# Patient Record
Sex: Female | Born: 1965
Health system: Southern US, Community
[De-identification: ages and names within clinical notes are randomized; demographics above are authoritative.]

## PROBLEM LIST (undated history)

## (undated) DIAGNOSIS — N182 Chronic kidney disease, stage 2 (mild): Secondary | ICD-10-CM

## (undated) DIAGNOSIS — R002 Palpitations: Secondary | ICD-10-CM

## (undated) DIAGNOSIS — J45909 Unspecified asthma, uncomplicated: Secondary | ICD-10-CM

## (undated) DIAGNOSIS — G40209 Localization-related (focal) (partial) symptomatic epilepsy and epileptic syndromes with complex partial seizures, not intractable, without status epilepticus: Secondary | ICD-10-CM

## (undated) DIAGNOSIS — I10 Essential (primary) hypertension: Secondary | ICD-10-CM

## (undated) DIAGNOSIS — G8929 Other chronic pain: Secondary | ICD-10-CM

## (undated) DIAGNOSIS — R0602 Shortness of breath: Secondary | ICD-10-CM

## (undated) DIAGNOSIS — N289 Disorder of kidney and ureter, unspecified: Secondary | ICD-10-CM

## (undated) DIAGNOSIS — I493 Ventricular premature depolarization: Secondary | ICD-10-CM

## (undated) DIAGNOSIS — I700 Atherosclerosis of aorta: Secondary | ICD-10-CM

## (undated) DIAGNOSIS — G4730 Sleep apnea, unspecified: Secondary | ICD-10-CM

## (undated) DIAGNOSIS — R0600 Dyspnea, unspecified: Secondary | ICD-10-CM

## (undated) DIAGNOSIS — E785 Hyperlipidemia, unspecified: Secondary | ICD-10-CM

## (undated) HISTORY — DX: Shortness of breath: R06.02

## (undated) HISTORY — DX: Chronic kidney disease, stage 2 (mild): N18.2

## (undated) HISTORY — DX: Other chronic pain: G89.29

## (undated) HISTORY — DX: Hyperlipidemia, unspecified: E78.5

## (undated) HISTORY — DX: Disorder of kidney and ureter, unspecified: N28.9

## (undated) HISTORY — DX: Dyspnea, unspecified: R06.00

## (undated) HISTORY — DX: Essential (primary) hypertension: I10

## (undated) HISTORY — DX: Localization-related (focal) (partial) symptomatic epilepsy and epileptic syndromes with complex partial seizures, not intractable, without status epilepticus: G40.209

## (undated) HISTORY — DX: Sleep apnea, unspecified: G47.30

## (undated) HISTORY — DX: Palpitations: R00.2

## (undated) HISTORY — DX: Unspecified asthma, uncomplicated: J45.909

## (undated) HISTORY — DX: Ventricular premature depolarization: I49.3

## (undated) HISTORY — PX: FOOT SURGERY: SHX648

## (undated) HISTORY — DX: Atherosclerosis of aorta: I70.0

---

## 2000-08-18 ENCOUNTER — Other Ambulatory Visit: Admission: RE | Admit: 2000-08-18 | Discharge: 2000-08-18 | Payer: Self-pay | Admitting: Obstetrics and Gynecology

## 2001-10-21 ENCOUNTER — Other Ambulatory Visit: Admission: RE | Admit: 2001-10-21 | Discharge: 2001-10-21 | Payer: Self-pay | Admitting: Obstetrics and Gynecology

## 2002-11-27 ENCOUNTER — Ambulatory Visit (HOSPITAL_COMMUNITY): Admission: RE | Admit: 2002-11-27 | Discharge: 2002-11-27 | Payer: Self-pay | Admitting: Neurology

## 2002-12-01 ENCOUNTER — Ambulatory Visit (HOSPITAL_COMMUNITY): Admission: RE | Admit: 2002-12-01 | Discharge: 2002-12-02 | Payer: Self-pay | Admitting: Neurosurgery

## 2002-12-01 ENCOUNTER — Encounter: Payer: Self-pay | Admitting: Neurosurgery

## 2002-12-01 HISTORY — PX: CERVICAL DISC SURGERY: SHX588

## 2003-02-17 HISTORY — PX: ABDOMINAL HYSTERECTOMY: SHX81

## 2003-04-26 ENCOUNTER — Encounter: Admission: RE | Admit: 2003-04-26 | Discharge: 2003-07-25 | Payer: Self-pay | Admitting: Neurosurgery

## 2003-06-25 ENCOUNTER — Ambulatory Visit (HOSPITAL_COMMUNITY): Admission: RE | Admit: 2003-06-25 | Discharge: 2003-06-25 | Payer: Self-pay | Admitting: Neurosurgery

## 2003-06-25 ENCOUNTER — Encounter: Payer: Self-pay | Admitting: Neurosurgery

## 2003-07-12 ENCOUNTER — Encounter: Payer: Self-pay | Admitting: Neurosurgery

## 2003-07-12 ENCOUNTER — Encounter: Admission: RE | Admit: 2003-07-12 | Discharge: 2003-07-12 | Payer: Self-pay | Admitting: Neurosurgery

## 2003-08-02 ENCOUNTER — Encounter: Admission: RE | Admit: 2003-08-02 | Discharge: 2003-08-02 | Payer: Self-pay | Admitting: Neurosurgery

## 2003-08-02 ENCOUNTER — Encounter: Payer: Self-pay | Admitting: Neurosurgery

## 2004-05-18 HISTORY — PX: BILATERAL OOPHORECTOMY: SHX1221

## 2004-09-29 ENCOUNTER — Ambulatory Visit (HOSPITAL_COMMUNITY): Admission: RE | Admit: 2004-09-29 | Discharge: 2004-09-29 | Payer: Self-pay | Admitting: Neurology

## 2004-11-27 ENCOUNTER — Ambulatory Visit (HOSPITAL_COMMUNITY): Admission: RE | Admit: 2004-11-27 | Discharge: 2004-11-27 | Payer: Self-pay | Admitting: Neurology

## 2005-10-28 ENCOUNTER — Encounter: Admission: RE | Admit: 2005-10-28 | Discharge: 2005-10-28 | Payer: Self-pay | Admitting: Internal Medicine

## 2005-11-20 ENCOUNTER — Encounter: Admission: RE | Admit: 2005-11-20 | Discharge: 2005-11-20 | Payer: Self-pay | Admitting: Internal Medicine

## 2006-05-01 ENCOUNTER — Encounter: Admission: RE | Admit: 2006-05-01 | Discharge: 2006-05-01 | Payer: Self-pay | Admitting: Internal Medicine

## 2006-07-14 ENCOUNTER — Encounter: Admission: RE | Admit: 2006-07-14 | Discharge: 2006-07-14 | Payer: Self-pay | Admitting: Internal Medicine

## 2006-10-06 ENCOUNTER — Ambulatory Visit (HOSPITAL_COMMUNITY): Admission: RE | Admit: 2006-10-06 | Discharge: 2006-10-06 | Payer: Self-pay | Admitting: Neurosurgery

## 2006-12-15 ENCOUNTER — Encounter: Admission: RE | Admit: 2006-12-15 | Discharge: 2006-12-15 | Payer: Self-pay | Admitting: Obstetrics and Gynecology

## 2006-12-24 DIAGNOSIS — R002 Palpitations: Secondary | ICD-10-CM

## 2006-12-24 DIAGNOSIS — R0600 Dyspnea, unspecified: Secondary | ICD-10-CM

## 2006-12-24 HISTORY — DX: Palpitations: R00.2

## 2006-12-24 HISTORY — DX: Dyspnea, unspecified: R06.00

## 2007-05-01 ENCOUNTER — Ambulatory Visit (HOSPITAL_COMMUNITY): Admission: RE | Admit: 2007-05-01 | Discharge: 2007-05-03 | Payer: Self-pay | Admitting: Neurosurgery

## 2007-05-01 HISTORY — PX: OTHER SURGICAL HISTORY: SHX169

## 2008-02-10 ENCOUNTER — Encounter: Admission: RE | Admit: 2008-02-10 | Discharge: 2008-02-10 | Payer: Self-pay | Admitting: Internal Medicine

## 2008-04-19 ENCOUNTER — Encounter: Admission: RE | Admit: 2008-04-19 | Discharge: 2008-04-19 | Payer: Self-pay | Admitting: Neurology

## 2008-05-12 ENCOUNTER — Ambulatory Visit (HOSPITAL_COMMUNITY): Admission: RE | Admit: 2008-05-12 | Discharge: 2008-05-12 | Payer: Self-pay | Admitting: Neurosurgery

## 2008-10-11 ENCOUNTER — Encounter: Admission: RE | Admit: 2008-10-11 | Discharge: 2008-10-11 | Payer: Self-pay | Admitting: Obstetrics and Gynecology

## 2008-11-20 ENCOUNTER — Emergency Department (HOSPITAL_COMMUNITY): Admission: EM | Admit: 2008-11-20 | Discharge: 2008-11-20 | Payer: Self-pay | Admitting: Emergency Medicine

## 2009-02-10 ENCOUNTER — Encounter: Admission: RE | Admit: 2009-02-10 | Discharge: 2009-02-10 | Payer: Self-pay | Admitting: Internal Medicine

## 2009-02-16 ENCOUNTER — Encounter: Admission: RE | Admit: 2009-02-16 | Discharge: 2009-02-16 | Payer: Self-pay | Admitting: Internal Medicine

## 2010-02-16 ENCOUNTER — Encounter: Admission: RE | Admit: 2010-02-16 | Discharge: 2010-02-16 | Payer: Self-pay | Admitting: Obstetrics and Gynecology

## 2010-03-29 ENCOUNTER — Emergency Department (HOSPITAL_COMMUNITY): Admission: EM | Admit: 2010-03-29 | Discharge: 2010-03-29 | Payer: Self-pay | Admitting: Emergency Medicine

## 2010-04-06 ENCOUNTER — Encounter: Admission: RE | Admit: 2010-04-06 | Discharge: 2010-04-06 | Payer: Self-pay | Admitting: Neurosurgery

## 2010-04-24 ENCOUNTER — Inpatient Hospital Stay (HOSPITAL_COMMUNITY): Admission: RE | Admit: 2010-04-24 | Discharge: 2010-04-27 | Payer: Self-pay | Admitting: Neurosurgery

## 2010-04-24 HISTORY — PX: OTHER SURGICAL HISTORY: SHX169

## 2010-11-23 ENCOUNTER — Encounter
Admission: RE | Admit: 2010-11-23 | Discharge: 2010-11-23 | Payer: Self-pay | Source: Home / Self Care | Attending: Rheumatology | Admitting: Rheumatology

## 2010-12-04 ENCOUNTER — Encounter
Admission: RE | Admit: 2010-12-04 | Discharge: 2010-12-04 | Payer: Self-pay | Source: Home / Self Care | Attending: Neurosurgery | Admitting: Neurosurgery

## 2010-12-08 ENCOUNTER — Encounter: Payer: Self-pay | Admitting: Neurology

## 2010-12-09 ENCOUNTER — Encounter: Payer: Self-pay | Admitting: Rheumatology

## 2010-12-09 ENCOUNTER — Encounter: Payer: Self-pay | Admitting: Internal Medicine

## 2010-12-10 ENCOUNTER — Encounter: Payer: Self-pay | Admitting: Sports Medicine

## 2010-12-10 ENCOUNTER — Other Ambulatory Visit: Payer: Self-pay | Admitting: Obstetrics and Gynecology

## 2010-12-10 DIAGNOSIS — N644 Mastodynia: Secondary | ICD-10-CM

## 2011-02-04 LAB — MRSA PCR SCREENING: MRSA by PCR: NEGATIVE

## 2011-02-04 LAB — BASIC METABOLIC PANEL
Calcium: 9.8 mg/dL (ref 8.4–10.5)
GFR calc Af Amer: >60 mL/min (ref >60)
GFR calc non Af Amer: 59 mL/min — ABNORMAL LOW (ref >60)
Sodium: 140 mEq/L (ref 135–145)

## 2011-02-04 LAB — CBC
Hemoglobin: 13.1 g/dL (ref 12.0–15.0)
RBC: 4.11 MIL/uL (ref 3.87–5.11)
RDW: 12.6 % (ref 11.5–15.5)

## 2011-02-04 LAB — SURGICAL PCR SCREEN: MRSA, PCR: NEGATIVE

## 2011-02-05 LAB — CBC
Hemoglobin: 12.9 g/dL (ref 12.0–15.0)
RDW: 12 % (ref 11.5–15.5)

## 2011-02-05 LAB — COMPREHENSIVE METABOLIC PANEL
ALT: 21 U/L (ref 0–35)
Albumin: 4.2 g/dL (ref 3.5–5.2)
Alkaline Phosphatase: 83 U/L (ref 39–117)
Glucose, Bld: 114 mg/dL — ABNORMAL HIGH (ref 70–99)
Potassium: 3.7 mEq/L (ref 3.5–5.1)
Sodium: 138 mEq/L (ref 135–145)
Total Protein: 8 g/dL (ref 6.0–8.3)

## 2011-02-05 LAB — URINALYSIS, ROUTINE W REFLEX MICROSCOPIC
Bilirubin Urine: NEGATIVE
Hgb urine dipstick: NEGATIVE
Ketones, ur: NEGATIVE mg/dL
Protein, ur: NEGATIVE mg/dL
Urobilinogen, UA: 0.2 mg/dL (ref 0.0–1.0)

## 2011-02-05 LAB — DIFFERENTIAL
Basophils Relative: 1 % (ref 0–1)
Eosinophils Absolute: 0 10*3/uL (ref 0.0–0.7)
Eosinophils Relative: 0 % (ref 0–5)
Monocytes Absolute: 0.3 10*3/uL (ref 0.1–1.0)
Monocytes Relative: 6 % (ref 3–12)
Neutrophils Relative %: 52 % (ref 43–77)

## 2011-02-05 LAB — RAPID URINE DRUG SCREEN, HOSP PERFORMED
Cocaine: NOT DETECTED
Tetrahydrocannabinol: NOT DETECTED

## 2011-02-18 ENCOUNTER — Ambulatory Visit
Admission: RE | Admit: 2011-02-18 | Discharge: 2011-02-18 | Disposition: A | Discharge status: Home / Self Care | Payer: Medicare Other | Source: Ambulatory Visit | Attending: Obstetrics and Gynecology | Admitting: Obstetrics and Gynecology

## 2011-02-18 DIAGNOSIS — N644 Mastodynia: Secondary | ICD-10-CM

## 2011-03-04 LAB — URINALYSIS, ROUTINE W REFLEX MICROSCOPIC
Ketones, ur: NEGATIVE mg/dL
Nitrite: NEGATIVE
Specific Gravity, Urine: 1.021 (ref 1.005–1.030)
pH: 5.5 (ref 5.0–8.0)

## 2011-03-04 LAB — CBC
HCT: 39.1 % (ref 36.0–46.0)
MCV: 92.9 fL (ref 78.0–100.0)
Platelets: 228 10*3/uL (ref 150–400)
RDW: 12.3 % (ref 11.5–15.5)

## 2011-03-04 LAB — COMPREHENSIVE METABOLIC PANEL
Albumin: 4.1 g/dL (ref 3.5–5.2)
BUN: 11 mg/dL (ref 6–23)
Creatinine, Ser: 0.93 mg/dL (ref 0.4–1.2)
Potassium: 3.8 mEq/L (ref 3.5–5.1)
Total Protein: 7.6 g/dL (ref 6.0–8.3)

## 2011-03-04 LAB — DIFFERENTIAL
Lymphocytes Relative: 45 % (ref 12–46)
Monocytes Absolute: 0.3 10*3/uL (ref 0.1–1.0)
Monocytes Relative: 7 % (ref 3–12)
Neutro Abs: 2 10*3/uL (ref 1.7–7.7)

## 2011-03-04 LAB — POCT PREGNANCY, URINE: Preg Test, Ur: NEGATIVE

## 2011-03-04 LAB — URINE MICROSCOPIC-ADD ON

## 2011-04-02 NOTE — Op Note (Signed)
NAME:  [name] [name], [name] [name]               ACCOUNT NO.:  [id number]   MEDICAL RECORD NO.:  [id number]          PATIENT TYPE:  OIB   LOCATION:  3172                         FACILITY:  [facility]   PHYSICIAN:  Hilda Lias, M.D.   DATE OF BIRTH:  02-Jan-1966   DATE OF PROCEDURE:  05/01/2007  DATE OF DISCHARGE:                               OPERATIVE REPORT   PREOPERATIVE DIAGNOSIS:  C5-C6 acute herniated disc, displacement of the  spinal cord, status post C4-C5 fusion.   POSTOPERATIVE DIAGNOSES:  C5-C6 acute herniated disc, displacement of  the spinal cord, status post C4-C5 fusion.   PROCEDURE:  Anterior C5-C6 discectomy, decompression of spinal cord,  foraminotomy, interbody fusion with allograft plate, microscope.   SURGEON:  Hilda Lias, M.D.   ASSISTANT:  Payton Doughty, M.D.   CLINICAL HISTORY:  The patient underwent anterior cervical discectomy  several years ago.  Lately, she had been complaining of neck pain with  radiation to the shoulder.  X-rays showed that she had good plate and  graft at the level of C4-C5.  The MRI showed that she has a large  herniated disc with almost complete displacement of the spinal cord.  Surgery was advised.  The risks were explained in the history and  physical.   DESCRIPTION OF PROCEDURE:  The patient was taken to the OR and after  intubation, which was done in a neutral position, the neck was prepped  with DuraPrep.  Then, a transverse incision was made through the skin  and subcutaneous tissue down to the cervical spine.  Immediately we  found the old plate of Z6-X0.  There was quite a bit of scar tissue  surrounding it.  We were able to enter the disc space and with the  microscope, we did a total discectomy.  We opened the posterior ligament  and, indeed, there was three large fragments of disc compressing the  spinal cord.  Removal was accomplished and we went laterally and we  performed foraminotomy to decompress both C6 nerve roots.  The  patient  had a lot of adhesion and lysis was accomplished with the microcuret.  At the end, we had good decompression of the spinal cord and both C6  nerve roots.  Then, the end plates were drilled and the allograft 8 mm  was done.  We were able to work our way into the level of C5 and because  of that, there was no reason to remove the plate.  There was overlap of  2 mm and the four screw was inserted without any problem.  Lateral C-  spine showed good position of bone graft.  There was plenty mobilization  and plenty of space for the plate.  From then on, the area was  irrigated.  Hemostasis was accomplished with bipolar.  There was no  evidence of bleeding.  The wound was closed with Vicryl and Steri-  Strips.           ______________________________  Hilda Lias, M.D.     EB/MEDQ  D:  05/01/2007  T:  05/02/2007  Job:  [id number]

## 2011-04-02 NOTE — H&P (Signed)
NAME:  [name], [name]               ACCOUNT NO.:  [id number]   MEDICAL RECORD NO.:  [id number]          PATIENT TYPE:  AMB   LOCATION:  SDS                          FACILITY:  [facility]   PHYSICIAN:  Hilda Lias, M.D.   DATE OF BIRTH:  August 19, 1966   DATE OF ADMISSION:  05/01/2007  DATE OF DISCHARGE:                              HISTORY & PHYSICAL   [name] [name] is a lady who was seen in my office and later on by the  neurologist because of neck pain.  Previously this lady has a fusion at  level 4-5 three or four years ago.  Nevertheless, in November last year  we did a myelogram which shows some stenosis at 4, 5 and 6.  She went to  see the neurologist because the pain was getting worse and they  proceeded with a cervical MRI which showed severe herniated disk at  level 5-6.  Because of these findings she was seen by me yesterday.  I  brought her husband to the office and last night will make a decision  about surgery.   PAST MEDICAL HISTORY:  Anterior cervical diskectomy at 4-5.  Hysterectomy.   ALLERGIES:  She is allergic to SULFA some SUN LOTION.   FAMILY HISTORY:  Unremarkable.   REVIEW OF SYSTEMS:  Positive for neck pain.   PHYSICAL EXAMINATION:  HEAD, EARS, NOSE, THROAT:  Normal.  NECK: She has scar anteriorly.  She has almost completely fixation of  the neck.  She is afraid to move her neck because of pain.  LUNGS:  Clear.  HEART:  Sounds are normal.  No murmurs.  __________.  NEUROLOGIC:  She has decreased flexion of cervical spine.  She has  weakness in the biceps.  Reflexes are 1+.  __________.   DATA REVIEWED:  Cervical MRI shows fusion of 4-5 and a large herniated  disk at the level of 5-6 with displacement of the spinal cord.  She has  a cervical spondylosis. __________.   IMPRESSION:  C5-6 herniated disk.   RECOMMENDATIONS:  The patient is being admitted for surgery.  The  procedure will be anterior cervical diskectomy at level 5-6.  The  surgeon will make the  decision about if we need to remove the plate to  put a new at level 5-6.  The risks include infection, CSF leak,  paralysis, need for surgery, worsening pain.           ______________________________  Hilda Lias, M.D.     EB/MEDQ  D:  05/01/2007  T:  05/02/2007  Job:  [id number]
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
## 2011-07-06 ENCOUNTER — Other Ambulatory Visit: Payer: Self-pay | Admitting: Neurosurgery

## 2011-07-06 DIAGNOSIS — M5416 Radiculopathy, lumbar region: Secondary | ICD-10-CM

## 2011-07-06 DIAGNOSIS — M549 Dorsalgia, unspecified: Secondary | ICD-10-CM

## 2011-07-06 DIAGNOSIS — M542 Cervicalgia: Secondary | ICD-10-CM

## 2011-07-06 DIAGNOSIS — M5412 Radiculopathy, cervical region: Secondary | ICD-10-CM

## 2011-07-10 ENCOUNTER — Ambulatory Visit
Admission: RE | Admit: 2011-07-10 | Discharge: 2011-07-10 | Disposition: A | Discharge status: Home / Self Care | Payer: Medicare Other | Source: Ambulatory Visit | Attending: Neurosurgery | Admitting: Neurosurgery

## 2011-07-10 DIAGNOSIS — M5416 Radiculopathy, lumbar region: Secondary | ICD-10-CM

## 2011-07-10 DIAGNOSIS — M549 Dorsalgia, unspecified: Secondary | ICD-10-CM

## 2011-07-10 DIAGNOSIS — M5412 Radiculopathy, cervical region: Secondary | ICD-10-CM

## 2011-07-10 DIAGNOSIS — M542 Cervicalgia: Secondary | ICD-10-CM

## 2011-07-10 MED ORDER — MEPERIDINE HCL 100 MG/ML IJ SOLN
75.0000 mg | Freq: Once | INTRAMUSCULAR | Status: AC
  Administered 2011-07-10: 75 mg via INTRAMUSCULAR

## 2011-07-10 MED ORDER — DIAZEPAM 2 MG PO TABS
10.0000 mg | ORAL_TABLET | Freq: Once | ORAL | Status: AC
  Administered 2011-07-10: 10 mg via ORAL

## 2011-07-10 MED ORDER — ONDANSETRON HCL 4 MG/2ML IJ SOLN
4.0000 mg | Freq: Once | INTRAMUSCULAR | Status: AC
  Administered 2011-07-10: 4 mg via INTRAMUSCULAR

## 2011-07-10 MED ORDER — IOHEXOL 300 MG/ML  SOLN
10.0000 mL | Freq: Once | INTRAMUSCULAR | Status: AC | PRN
  Administered 2011-07-10: 10 mL via INTRATHECAL

## 2011-07-10 MED ORDER — SODIUM CHLORIDE 0.9 % IV SOLN: 4.0000 mg | Freq: Four times a day (QID) | INTRAVENOUS | Status: DC | PRN

## 2011-07-10 NOTE — Progress Notes (Signed)
Pt resting quietly post procedure, husband at bedside

## 2011-09-05 LAB — CBC
HCT: 39.8
Hemoglobin: 13.3
MCHC: 33.5
MCV: 91.7
RBC: 4.34

## 2011-09-05 LAB — BASIC METABOLIC PANEL
CO2: 29
Chloride: 104
GFR calc Af Amer: >60
Glucose, Bld: 101 — ABNORMAL HIGH
Potassium: 4
Sodium: 141

## 2012-02-04 ENCOUNTER — Other Ambulatory Visit: Payer: Self-pay | Admitting: Obstetrics and Gynecology

## 2012-02-04 DIAGNOSIS — Z1231 Encounter for screening mammogram for malignant neoplasm of breast: Secondary | ICD-10-CM

## 2012-02-24 ENCOUNTER — Ambulatory Visit
Admission: RE | Admit: 2012-02-24 | Discharge: 2012-02-24 | Disposition: A | Discharge status: Home / Self Care | Payer: 59 | Source: Ambulatory Visit | Attending: Obstetrics and Gynecology | Admitting: Obstetrics and Gynecology

## 2012-02-24 DIAGNOSIS — Z1231 Encounter for screening mammogram for malignant neoplasm of breast: Secondary | ICD-10-CM

## 2012-03-30 ENCOUNTER — Other Ambulatory Visit: Payer: Self-pay | Admitting: Neurosurgery

## 2012-03-30 ENCOUNTER — Ambulatory Visit
Admission: RE | Admit: 2012-03-30 | Discharge: 2012-03-30 | Disposition: A | Discharge status: Home / Self Care | Payer: 59 | Source: Ambulatory Visit | Attending: Neurosurgery | Admitting: Neurosurgery

## 2012-03-30 DIAGNOSIS — M542 Cervicalgia: Secondary | ICD-10-CM

## 2012-04-20 DIAGNOSIS — R0602 Shortness of breath: Secondary | ICD-10-CM

## 2012-04-20 HISTORY — DX: Shortness of breath: R06.02

## 2012-04-20 LAB — PULMONARY FUNCTION TEST

## 2012-08-31 ENCOUNTER — Other Ambulatory Visit: Payer: Self-pay | Admitting: Anesthesiology

## 2012-08-31 DIAGNOSIS — M542 Cervicalgia: Secondary | ICD-10-CM

## 2012-09-01 ENCOUNTER — Other Ambulatory Visit: Payer: 59

## 2012-09-01 ENCOUNTER — Ambulatory Visit
Admission: RE | Admit: 2012-09-01 | Discharge: 2012-09-01 | Disposition: A | Discharge status: Home / Self Care | Payer: 59 | Source: Ambulatory Visit | Attending: Anesthesiology | Admitting: Anesthesiology

## 2012-09-01 VITALS — BP 149/90 | HR 72

## 2012-09-01 DIAGNOSIS — M542 Cervicalgia: Secondary | ICD-10-CM

## 2012-09-01 MED ORDER — IOHEXOL 300 MG/ML  SOLN
10.0000 mL | Freq: Once | INTRAMUSCULAR | Status: AC | PRN
  Administered 2012-09-01: 10 mL via INTRATHECAL

## 2012-09-01 MED ORDER — ONDANSETRON HCL 4 MG/2ML IJ SOLN: 4.0000 mg | Freq: Four times a day (QID) | INTRAMUSCULAR | Status: DC | PRN

## 2012-09-01 MED ORDER — DIAZEPAM 5 MG PO TABS
10.0000 mg | ORAL_TABLET | Freq: Once | ORAL | Status: AC
  Administered 2012-09-01: 10 mg via ORAL

## 2012-09-01 NOTE — Progress Notes (Addendum)
Post myelo sat pt up for about 1 minute to try to get contrast out of neck, then cold pack applied. Pt states it already feels a little better.  Husband at bedside.

## 2012-09-03 ENCOUNTER — Other Ambulatory Visit: Payer: 59

## 2012-09-30 ENCOUNTER — Other Ambulatory Visit: Payer: Self-pay | Admitting: Neurosurgery

## 2012-09-30 DIAGNOSIS — M549 Dorsalgia, unspecified: Secondary | ICD-10-CM

## 2012-09-30 DIAGNOSIS — M546 Pain in thoracic spine: Secondary | ICD-10-CM

## 2012-10-04 ENCOUNTER — Ambulatory Visit
Admission: RE | Admit: 2012-10-04 | Discharge: 2012-10-04 | Disposition: A | Discharge status: Home / Self Care | Payer: 59 | Source: Ambulatory Visit | Attending: Neurosurgery | Admitting: Neurosurgery

## 2012-10-04 DIAGNOSIS — M549 Dorsalgia, unspecified: Secondary | ICD-10-CM

## 2012-10-04 DIAGNOSIS — M546 Pain in thoracic spine: Secondary | ICD-10-CM

## 2013-01-19 ENCOUNTER — Other Ambulatory Visit: Payer: Self-pay

## 2013-02-26 ENCOUNTER — Ambulatory Visit: Payer: 59

## 2013-03-12 ENCOUNTER — Other Ambulatory Visit: Payer: Self-pay | Admitting: Nurse Practitioner

## 2013-03-12 DIAGNOSIS — R109 Unspecified abdominal pain: Secondary | ICD-10-CM

## 2013-03-12 DIAGNOSIS — R1031 Right lower quadrant pain: Secondary | ICD-10-CM

## 2013-03-17 ENCOUNTER — Ambulatory Visit: Payer: 59

## 2013-03-17 ENCOUNTER — Other Ambulatory Visit: Payer: 59

## 2013-03-19 ENCOUNTER — Other Ambulatory Visit: Payer: 59

## 2013-03-19 ENCOUNTER — Other Ambulatory Visit: Payer: Self-pay | Admitting: Nurse Practitioner

## 2013-03-19 ENCOUNTER — Ambulatory Visit
Admission: RE | Admit: 2013-03-19 | Discharge: 2013-03-19 | Disposition: A | Discharge status: Home / Self Care | Payer: 59 | Source: Ambulatory Visit | Attending: Nurse Practitioner | Admitting: Nurse Practitioner

## 2013-03-19 DIAGNOSIS — R1031 Right lower quadrant pain: Secondary | ICD-10-CM

## 2013-03-19 DIAGNOSIS — R109 Unspecified abdominal pain: Secondary | ICD-10-CM

## 2013-03-20 ENCOUNTER — Encounter: Payer: Self-pay | Admitting: Pharmacist Clinician (PhC)/ Clinical Pharmacy Specialist

## 2013-03-23 ENCOUNTER — Encounter: Payer: Self-pay | Admitting: Cardiovascular Disease

## 2013-03-24 ENCOUNTER — Ambulatory Visit
Admission: RE | Admit: 2013-03-24 | Discharge: 2013-03-24 | Disposition: A | Discharge status: Home / Self Care | Payer: 59 | Source: Ambulatory Visit

## 2013-03-24 DIAGNOSIS — Z1231 Encounter for screening mammogram for malignant neoplasm of breast: Secondary | ICD-10-CM

## 2013-03-31 ENCOUNTER — Ambulatory Visit: Payer: 59 | Admitting: Cardiovascular Disease

## 2013-04-05 ENCOUNTER — Other Ambulatory Visit: Payer: Self-pay | Admitting: Internal Medicine

## 2013-04-05 DIAGNOSIS — K4090 Unilateral inguinal hernia, without obstruction or gangrene, not specified as recurrent: Secondary | ICD-10-CM

## 2013-04-07 ENCOUNTER — Ambulatory Visit
Admission: RE | Admit: 2013-04-07 | Discharge: 2013-04-07 | Disposition: A | Discharge status: Home / Self Care | Payer: 59 | Source: Ambulatory Visit | Attending: Internal Medicine | Admitting: Internal Medicine

## 2013-04-07 DIAGNOSIS — K4090 Unilateral inguinal hernia, without obstruction or gangrene, not specified as recurrent: Secondary | ICD-10-CM

## 2013-04-07 MED ORDER — IOHEXOL 300 MG/ML  SOLN
100.0000 mL | Freq: Once | INTRAMUSCULAR | Status: AC | PRN
  Administered 2013-04-07: 100 mL via INTRAVENOUS

## 2013-04-09 ENCOUNTER — Emergency Department (HOSPITAL_COMMUNITY): Payer: 59

## 2013-04-09 ENCOUNTER — Emergency Department (HOSPITAL_COMMUNITY)
Admission: EM | Admit: 2013-04-09 | Discharge: 2013-04-09 | Disposition: A | Discharge status: Home / Self Care | Payer: 59 | Attending: Emergency Medicine | Admitting: Emergency Medicine

## 2013-04-09 ENCOUNTER — Encounter (HOSPITAL_COMMUNITY): Payer: Self-pay | Admitting: Emergency Medicine

## 2013-04-09 DIAGNOSIS — Y9389 Activity, other specified: Secondary | ICD-10-CM | POA: Insufficient documentation

## 2013-04-09 DIAGNOSIS — IMO0002 Reserved for concepts with insufficient information to code with codable children: Secondary | ICD-10-CM | POA: Insufficient documentation

## 2013-04-09 DIAGNOSIS — Z8679 Personal history of other diseases of the circulatory system: Secondary | ICD-10-CM | POA: Insufficient documentation

## 2013-04-09 DIAGNOSIS — Z79899 Other long term (current) drug therapy: Secondary | ICD-10-CM | POA: Insufficient documentation

## 2013-04-09 DIAGNOSIS — M791 Myalgia, unspecified site: Secondary | ICD-10-CM

## 2013-04-09 DIAGNOSIS — I10 Essential (primary) hypertension: Secondary | ICD-10-CM | POA: Insufficient documentation

## 2013-04-09 DIAGNOSIS — Z8709 Personal history of other diseases of the respiratory system: Secondary | ICD-10-CM | POA: Insufficient documentation

## 2013-04-09 DIAGNOSIS — Y92410 Unspecified street and highway as the place of occurrence of the external cause: Secondary | ICD-10-CM | POA: Insufficient documentation

## 2013-04-09 DIAGNOSIS — G40909 Epilepsy, unspecified, not intractable, without status epilepticus: Secondary | ICD-10-CM | POA: Insufficient documentation

## 2013-04-09 DIAGNOSIS — S0993XA Unspecified injury of face, initial encounter: Secondary | ICD-10-CM | POA: Insufficient documentation

## 2013-04-09 DIAGNOSIS — R209 Unspecified disturbances of skin sensation: Secondary | ICD-10-CM | POA: Insufficient documentation

## 2013-04-09 DIAGNOSIS — Z981 Arthrodesis status: Secondary | ICD-10-CM | POA: Insufficient documentation

## 2013-04-09 DIAGNOSIS — Z87828 Personal history of other (healed) physical injury and trauma: Secondary | ICD-10-CM | POA: Insufficient documentation

## 2013-04-09 DIAGNOSIS — Z7982 Long term (current) use of aspirin: Secondary | ICD-10-CM | POA: Insufficient documentation

## 2013-04-09 MED ORDER — HYDROCODONE-ACETAMINOPHEN 5-325 MG PO TABS: 1.0000 | ORAL_TABLET | Freq: Once | ORAL | Status: DC

## 2013-04-09 MED ORDER — HYDROCODONE-ACETAMINOPHEN 5-325 MG PO TABS
1.0000 | ORAL_TABLET | Freq: Once | ORAL | Status: AC
  Administered 2013-04-09: 1 via ORAL
  Filled 2013-04-09: qty 1

## 2013-04-09 MED ORDER — CYCLOBENZAPRINE HCL 10 MG PO TABS: 10.0000 mg | ORAL_TABLET | Freq: Two times a day (BID) | ORAL | Status: DC | PRN

## 2013-04-09 NOTE — ED Notes (Signed)
Pt returned from MRI °
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
## 2013-04-09 NOTE — ED Notes (Signed)
Patient transported to MRI 

## 2013-04-09 NOTE — ED Provider Notes (Signed)
Medical screening examination/treatment/procedure(s) were performed by non-physician practitioner and as supervising physician I was immediately available for consultation/collaboration.    Celene Kras, MD 04/09/13 [phone]

## 2013-04-09 NOTE — ED Notes (Signed)
Patient to xray.

## 2013-04-09 NOTE — ED Notes (Signed)
Attempted hourly rounding - pt not in room

## 2013-04-09 NOTE — ED Notes (Signed)
Pt complains of pain in lumbar spine as well as middle of thoracic region.

## 2013-04-09 NOTE — ED Notes (Signed)
Went to take vitals but pt has not returned to room from MRI
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
## 2013-04-09 NOTE — ED Provider Notes (Signed)
Medical screening examination/treatment/procedure(s) were performed by non-physician practitioner and as supervising physician I was immediately available for consultation/collaboration.    Celene Kras, MD 04/09/13 [phone]
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
## 2013-05-07 ENCOUNTER — Other Ambulatory Visit: Payer: Self-pay | Admitting: Neurosurgery

## 2013-05-07 DIAGNOSIS — M545 Low back pain: Secondary | ICD-10-CM

## 2013-05-14 ENCOUNTER — Ambulatory Visit
Admission: RE | Admit: 2013-05-14 | Discharge: 2013-05-14 | Disposition: A | Discharge status: Home / Self Care | Payer: 59 | Source: Ambulatory Visit | Attending: Neurosurgery | Admitting: Neurosurgery

## 2013-05-14 DIAGNOSIS — M545 Low back pain: Secondary | ICD-10-CM
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
## 2013-07-06 ENCOUNTER — Ambulatory Visit (INDEPENDENT_AMBULATORY_CARE_PROVIDER_SITE_OTHER): Payer: 59 | Admitting: Physician Assistant

## 2013-07-06 ENCOUNTER — Encounter: Payer: Self-pay | Admitting: Physician Assistant

## 2013-07-06 ENCOUNTER — Telehealth: Payer: Self-pay | Admitting: Cardiovascular Disease

## 2013-07-06 VITALS — BP 140/90 | HR 62 | Ht 67.0 in | Wt 174.2 lb

## 2013-07-06 DIAGNOSIS — R002 Palpitations: Secondary | ICD-10-CM

## 2013-07-06 DIAGNOSIS — I499 Cardiac arrhythmia, unspecified: Secondary | ICD-10-CM | POA: Insufficient documentation

## 2013-07-06 DIAGNOSIS — I4949 Other premature depolarization: Secondary | ICD-10-CM

## 2013-07-06 DIAGNOSIS — I10 Essential (primary) hypertension: Secondary | ICD-10-CM

## 2013-07-06 DIAGNOSIS — I493 Ventricular premature depolarization: Secondary | ICD-10-CM | POA: Insufficient documentation

## 2013-07-06 DIAGNOSIS — R000 Tachycardia, unspecified: Secondary | ICD-10-CM

## 2013-07-06 NOTE — Telephone Encounter (Signed)
Pt asked that Dr. Doristine Locks needs to be removed off her list of doctors that she sees. She is no longer seeing this physician

## 2013-07-06 NOTE — Telephone Encounter (Signed)
Dr. Doristine Locks removed from [facility] at patients' request.

## 2013-07-06 NOTE — Patient Instructions (Signed)
Your physician has recommended that you wear an event monitor. Event monitors are medical devices that record the heart's electrical activity. Doctors most often [place] these monitors to diagnose arrhythmias. Arrhythmias are problems with the speed or rhythm of the heartbeat. The monitor is a small, portable device. You can wear one while you do your normal daily activities. This is usually used to diagnose what is causing palpitations/syncope (passing out).  Your physician recommends that you schedule a follow-up appointment with Dr.Berry after monitor. Sooner if needed.

## 2013-07-06 NOTE — Assessment & Plan Note (Signed)
Patient's son noticed increased frequency of periods where she has an irregular heart rate. She said episode today which lasted approximately 20 minutes.  She will be set up with a CardioNet monitor and follow up in 2 weeks.

## 2013-07-06 NOTE — Progress Notes (Signed)
Date:  07/06/2013   ID:  [name] [name], DOB 1966-10-11, MRN [id number]  PCP:  [name] [name]., MD  Primary Cardiologist:  Allyson Sabal     History of Present Illness: [name] [name] is a 47 y.o. female was accompanied by her husband [name] [name] has a history of PVCs hypertension, partials complex seizures. 2-D echocardiogram 12/24/2006 which showed normal LV systolic function a Myoview performed the same time which was nonischemic in the mid chest July 2013 which was normal.  Patient presents today due to increased episodes of irregular heart rate. She states that he happen mostly at night however this morning she had one that lasted approximately 20 minutes these episodes happen 4-5 times per day. She notices increasing shortness of breath, fatigue, dizziness. She states that "it feels like he couldn't catch my breath". She tried using her wrist blood pressure cuff and it would not work.  The patient currently denies nausea, vomiting, fever, chest pain, orthopnea, PND, cough, congestion, abdominal pain, hematochezia, melena, lower extremity edema.  Wt Readings from Last 3 Encounters:  07/06/13 174 lb 3.2 oz (79.017 kg)     Past Medical History  Diagnosis Date  . Palpitations 12/24/2006    Echo - EF >55%; flow pattern normal for age; aortic valve mildly sclerotic  . SOB (shortness of breath) 04/20/2012    MET test - exercise limiting factor - chronotropic incompetence (medication), peak VO2 76% of predicted; no EKG ST changes noted at sub-optimal peak HR  . Dyspnea 12/24/2006    B/P MV - normal perfusion in all regions; post stress LV normal in size; EF 63%; no significant ischemia noted  . Palpitations 01/01/2007    30 day event monitor - 69 pt reported events- some PAC and PVCs reported  . Hypertension   . Complex partial seizures     Current Outpatient Prescriptions  Medication Sig Dispense Refill  . acetaminophen (TYLENOL) 500 MG tablet Take 1,000 mg by mouth every 6 (six) hours as  needed for pain (pain).      Marland Kitchen aspirin EC 81 MG tablet Take 81 mg by mouth daily.      . baclofen (LIORESAL) 20 MG tablet Take 20 mg by mouth 2 (two) times daily.      . cyclobenzaprine (FLEXERIL) 10 MG tablet Take 1 tablet (10 mg total) by mouth 2 (two) times daily as needed for muscle spasms.  20 tablet  0  . diltiazem (CARDIZEM SR) 120 MG 12 hr capsule Take 120 mg by mouth 2 (two) times daily.      Marland Kitchen gabapentin (NEURONTIN) 300 MG capsule Take 300 mg by mouth 2 (two) times daily.      Marland Kitchen HYDROcodone-acetaminophen (NORCO/VICODIN) 5-325 MG per tablet Take 1 tablet by mouth once.  15 tablet  0  . ibuprofen (ADVIL,MOTRIN) 200 MG tablet Take 200 mg by mouth every 6 (six) hours as needed for pain.      Marland Kitchen therapeutic multivitamin-minerals (THERAGRAN-M) tablet Take 1 tablet by mouth every other day.       No current facility-administered medications for this visit.    Allergies:    Allergies  Allergen Reactions  . Sarna [Lip Medex] Other (See Comments)    resp distress  . Sulfa Antibiotics Other (See Comments)    "General malaze"    Social History:  The patient  reports that she has never smoked. She has never used smokeless tobacco.   Family history:   Family History  Problem Relation Age of Onset  .  Diabetes Mother   . Hypertension Mother   . Cancer - Other Mother     breast    ROS:  Please see the history of present illness.  All other systems reviewed and negative.   PHYSICAL EXAM: VS:  BP 140/90  Pulse 62  Ht 5\' 7"  (1.702 m)  Wt 174 lb 3.2 oz (79.017 kg)  BMI 27.28 kg/m2 Well nourished, well developed, in no acute distress HEENT: Pupils are equal round react to light accommodation extraocular movements are intact.  Neck: no JVDNo cervical lymphadenopathy. Cardiac: Regular rate and rhythm without murmurs rubs or gallops. Lungs:  clear to auscultation bilaterally, no wheezing, rhonchi or rales Abd: soft, nontender, positive bowel sounds all quadrants, no  hepatosplenomegaly Ext: no lower extremity edema.  2+ radial and dorsalis pedis pulses. Skin: warm and dry Neuro:  Grossly normal  EKG:  Normal sinus rhythm rate 62 beats per minute  ASSESSMENT AND PLAN:  Problem List Items Addressed This Visit   PVC's (premature ventricular contractions)   Irregular heart rate     Patient's son noticed increased frequency of periods where she has an irregular heart rate. She said episode today which lasted approximately 20 minutes.  She will be set up with a CardioNet monitor and follow up in 2 weeks.      Essential hypertension    Other Visit Diagnoses   Heart palpitations    -  Primary    Relevant Orders       EKG 12-Lead       Cardiac event monitor            Increase aspirin to 325 mg daily

## 2013-08-12 ENCOUNTER — Ambulatory Visit: Payer: 59 | Admitting: Cardiovascular Disease

## 2013-08-16 ENCOUNTER — Ambulatory Visit (INDEPENDENT_AMBULATORY_CARE_PROVIDER_SITE_OTHER): Payer: 59 | Admitting: Cardiovascular Disease

## 2013-08-16 ENCOUNTER — Encounter: Payer: Self-pay | Admitting: Cardiovascular Disease

## 2013-08-16 VITALS — BP 160/84 | HR 80 | Ht 67.0 in | Wt 173.0 lb

## 2013-08-16 DIAGNOSIS — I4949 Other premature depolarization: Secondary | ICD-10-CM

## 2013-08-16 DIAGNOSIS — I493 Ventricular premature depolarization: Secondary | ICD-10-CM

## 2013-08-16 NOTE — Patient Instructions (Addendum)
Your physician wants you to follow-up in: 1 year with Dr Berry. You will receive a reminder letter in the mail two months in advance. If you don't receive a letter, please call our office to schedule the follow-up appointment.  

## 2013-08-16 NOTE — Assessment & Plan Note (Signed)
The patient saw Huey Bienenstock PA-C in the office 07/06/13 with complaints of palpitations. He ordered a one-month event monitor which showed occasional unifocal PVCs. The patient  relates eating excessive amount of chocolate chip cookies along with TE suggesting increased caffeine intake which is subtotally stopped doing that her palpitations have improved.

## 2013-08-16 NOTE — Progress Notes (Signed)
08/16/2013 [name] [name]   04/10/1966  [id number]  Primary Physician Alva Garnet., MD Primary Cardiologist: Runell Gess MD [name] [name]   HPI:  The patient is a 47 year old, mildly overweight, married [place] American female, mother of 2 children who is accompanied by her husband today, [name] [name], who is also a patient of mine. I last saw her a year ago. She has a history of PVCs and hypertension. She also has had cervical disk disease and has been operated on by Dr. Hilda Lias. She has complex partial seizures. Event monitor in the past has shown PVCs. She did have a 2D echo performed December 24, 2006, which revealed normal LV systolic function and a Myoview performed at the same time which was nonischemic. Her major complaints were of  increasing dyspnea on exertion for unclear reasons. I performed a MET test which was normal. sshe saw Huey Bienenstock PAC back in the office last month with complaints of palpitations that are increasing in frequency and/or severity. He wear an event monitor that you showed occasional unifocal PVCs. She did admit to increasing congestion of chocolate chip cookies and tea of which are caffeinated. Since stopping both of those foods her palpitations have improved.     Current Outpatient Prescriptions  Medication Sig Dispense Refill  . acetaminophen (TYLENOL) 500 MG tablet Take 1,000 mg by mouth every 6 (six) hours as needed for pain (pain).      Marland Kitchen aspirin 325 MG tablet Take 325 mg by mouth daily.      . baclofen (LIORESAL) 20 MG tablet Take 20 mg by mouth 2 (two) times daily.      . cyclobenzaprine (FLEXERIL) 10 MG tablet Take 1 tablet (10 mg total) by mouth 2 (two) times daily as needed for muscle spasms.  20 tablet  0  . diltiazem (CARDIZEM SR) 120 MG 12 hr capsule Take 120 mg by mouth 2 (two) times daily.      Marland Kitchen gabapentin (NEURONTIN) 300 MG capsule Take 300 mg by mouth. 300mg  in the morning and 600mg  in the evening      .  HYDROcodone-acetaminophen (NORCO/VICODIN) 5-325 MG per tablet Take 1 tablet by mouth once.  15 tablet  0  . ibuprofen (ADVIL,MOTRIN) 200 MG tablet Take 200 mg by mouth every 6 (six) hours as needed for pain.      Marland Kitchen lamoTRIgine (LAMICTAL) 25 MG tablet Take 25 mg by mouth. 2 tablets daily      . Multiple Vitamin (MULTIVITAMIN) capsule Take 1 capsule by mouth daily. With iron       No current facility-administered medications for this visit.    Allergies  Allergen Reactions  . Sulfa Antibiotics Other (See Comments)    "General malaze"  . Sarna Sensitive     Respiratory distress with the Sarna lotion    History   Social History  . Marital Status: Married    Spouse Name: N/A    Number of Children: N/A  . Years of Education: N/A   Occupational History  . Not on file.   Social History Main Topics  . Smoking status: Never Smoker   . Smokeless tobacco: Never Used  . Alcohol Use: Not on file  . Drug Use: Not on file  . Sexual Activity: Not on file   Other Topics Concern  . Not on file   Social History Narrative  . No narrative on file     Review of Systems: General: negative for chills, fever, night sweats or weight  changes.  Cardiovascular: negative for chest pain, dyspnea on exertion, edema, orthopnea, palpitations, paroxysmal nocturnal dyspnea or shortness of breath Dermatological: negative for rash Respiratory: negative for cough or wheezing Urologic: negative for hematuria Abdominal: negative for nausea, vomiting, diarrhea, bright red blood per rectum, melena, or hematemesis Neurologic: negative for visual changes, syncope, or dizziness All other systems reviewed and are otherwise negative except as noted above.    Blood pressure 160/84, pulse 80, height 5\' 7"  (1.702 m), weight 173 lb (78.472 kg).  General appearance: alert and no distress Neck: no adenopathy, no carotid bruit, no JVD, supple, symmetrical, trachea midline and thyroid not enlarged, symmetric, no  tenderness/mass/nodules Lungs: clear to auscultation bilaterally Heart: regular rate and rhythm, S1, S2 normal, no murmur, click, rub or gallop Extremities: extremities normal, atraumatic, no cyanosis or edema  EKG not performed today  ASSESSMENT AND PLAN:   PVC's (premature ventricular contractions) The patient saw Huey Bienenstock PA-C in the office 07/06/13 with complaints of palpitations. He ordered a one-month event monitor which showed occasional unifocal PVCs. The patient  relates eating excessive amount of chocolate chip cookies along with TE suggesting increased caffeine intake which is subtotally stopped doing that her palpitations have improved.      Runell Gess MD FACP,FACC,FAHA, [facility] 08/16/2013 4:19 PM

## 2013-08-17 ENCOUNTER — Telehealth: Payer: Self-pay | Admitting: Cardiovascular Disease
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
## 2013-08-17 NOTE — Telephone Encounter (Signed)
Please call question about her monitor report ,also forgot to give him some information when she saw him yesterday.

## 2013-08-18 ENCOUNTER — Telehealth: Payer: Self-pay | Admitting: *Deleted

## 2013-08-18 NOTE — Telephone Encounter (Signed)
Called w/event monitor results.  States Dr. Allyson Sabal discussed at OV on 08/17/13.

## 2013-08-20 ENCOUNTER — Telehealth: Payer: Self-pay | Admitting: Cardiovascular Disease

## 2013-08-20 NOTE — Telephone Encounter (Signed)
Wants to know if she need to decrease her aspirin back to 81 mg?

## 2013-08-20 NOTE — Telephone Encounter (Signed)
Pt states that Wilburt Finlay had increase ASA 325 mg prior to monitor being placed. She wants to know if she needs to return back to ASA 81. Will defer to [facility] and call her back.

## 2013-08-24 NOTE — Telephone Encounter (Signed)
I talked to bryan and received orders to cut asa back to 81mg . I will call pt. And let her know.

## 2013-09-23 ENCOUNTER — Other Ambulatory Visit: Payer: Self-pay

## 2013-11-03 ENCOUNTER — Other Ambulatory Visit: Payer: Self-pay | Admitting: Internal Medicine

## 2013-11-03 DIAGNOSIS — N6320 Unspecified lump in the left breast, unspecified quadrant: Secondary | ICD-10-CM

## 2013-11-03 DIAGNOSIS — N6310 Unspecified lump in the right breast, unspecified quadrant: Secondary | ICD-10-CM

## 2013-11-16 ENCOUNTER — Other Ambulatory Visit: Payer: 59

## 2013-11-16 ENCOUNTER — Ambulatory Visit
Admission: RE | Admit: 2013-11-16 | Discharge: 2013-11-16 | Disposition: A | Discharge status: Home / Self Care | Payer: 59 | Source: Ambulatory Visit | Attending: Internal Medicine | Admitting: Internal Medicine

## 2013-11-16 DIAGNOSIS — N6310 Unspecified lump in the right breast, unspecified quadrant: Secondary | ICD-10-CM

## 2013-11-16 DIAGNOSIS — N6320 Unspecified lump in the left breast, unspecified quadrant: Secondary | ICD-10-CM

## 2014-02-17 ENCOUNTER — Other Ambulatory Visit (HOSPITAL_COMMUNITY): Payer: Self-pay | Admitting: Otolaryngology

## 2014-02-17 DIAGNOSIS — J329 Chronic sinusitis, unspecified: Secondary | ICD-10-CM

## 2014-02-21 ENCOUNTER — Ambulatory Visit (HOSPITAL_COMMUNITY): Payer: 59

## 2014-02-23 ENCOUNTER — Ambulatory Visit (HOSPITAL_COMMUNITY)
Admission: RE | Admit: 2014-02-23 | Discharge: 2014-02-23 | Disposition: A | Discharge status: Home / Self Care | Payer: 59 | Source: Ambulatory Visit | Attending: Otolaryngology | Admitting: Otolaryngology

## 2014-02-23 ENCOUNTER — Encounter (HOSPITAL_COMMUNITY): Payer: Self-pay

## 2014-02-23 ENCOUNTER — Other Ambulatory Visit (HOSPITAL_COMMUNITY): Payer: Self-pay | Admitting: Otolaryngology

## 2014-02-23 DIAGNOSIS — J329 Chronic sinusitis, unspecified: Secondary | ICD-10-CM

## 2014-02-23 DIAGNOSIS — R51 Headache: Secondary | ICD-10-CM | POA: Insufficient documentation

## 2014-02-23 MED ORDER — IOHEXOL 300 MG/ML  SOLN
100.0000 mL | Freq: Once | INTRAMUSCULAR | Status: AC | PRN
  Administered 2014-02-23: 100 mL via INTRAVENOUS

## 2014-10-11 ENCOUNTER — Other Ambulatory Visit: Payer: Self-pay

## 2014-10-11 DIAGNOSIS — Z1231 Encounter for screening mammogram for malignant neoplasm of breast: Secondary | ICD-10-CM

## 2014-11-04 ENCOUNTER — Ambulatory Visit (HOSPITAL_COMMUNITY): Payer: 59

## 2014-11-04 ENCOUNTER — Other Ambulatory Visit: Payer: Self-pay | Admitting: Gastroenterology

## 2014-11-04 ENCOUNTER — Other Ambulatory Visit: Payer: Self-pay | Admitting: Family

## 2014-11-04 ENCOUNTER — Ambulatory Visit (HOSPITAL_COMMUNITY)
Admission: RE | Admit: 2014-11-04 | Discharge: 2014-11-04 | Disposition: A | Discharge status: Home / Self Care | Payer: 59 | Source: Ambulatory Visit | Attending: Gastroenterology | Admitting: Gastroenterology

## 2014-11-04 DIAGNOSIS — R110 Nausea: Secondary | ICD-10-CM

## 2014-11-04 DIAGNOSIS — R1033 Periumbilical pain: Secondary | ICD-10-CM | POA: Insufficient documentation

## 2014-11-04 DIAGNOSIS — R1084 Generalized abdominal pain: Secondary | ICD-10-CM

## 2014-11-04 MED ORDER — IOHEXOL 300 MG/ML  SOLN
100.0000 mL | Freq: Once | INTRAMUSCULAR | Status: AC | PRN
  Administered 2014-11-04: 100 mL via INTRAVENOUS

## 2014-11-21 ENCOUNTER — Ambulatory Visit
Admission: RE | Admit: 2014-11-21 | Discharge: 2014-11-21 | Disposition: A | Discharge status: Home / Self Care | Payer: Medicare Other | Source: Ambulatory Visit

## 2014-11-21 DIAGNOSIS — Z1231 Encounter for screening mammogram for malignant neoplasm of breast: Secondary | ICD-10-CM

## 2014-11-30 ENCOUNTER — Ambulatory Visit: Payer: 59

## 2015-01-01 ENCOUNTER — Encounter: Payer: Self-pay | Admitting: Cardiology

## 2015-06-06 ENCOUNTER — Telehealth: Payer: Self-pay | Admitting: Cardiology

## 2015-06-06 NOTE — Telephone Encounter (Signed)
[facility]15 for appointment on 06/28/15 with Dr Andreas OhmKatrina [name].Sent via I/O mail. lp

## 2015-06-23 ENCOUNTER — Ambulatory Visit: Payer: Medicare Other | Admitting: Cardiology

## 2015-06-28 ENCOUNTER — Ambulatory Visit: Payer: Medicare Other | Admitting: Cardiology

## 2015-06-28 ENCOUNTER — Ambulatory Visit (INDEPENDENT_AMBULATORY_CARE_PROVIDER_SITE_OTHER): Payer: Commercial Managed Care - HMO | Admitting: Cardiology

## 2015-06-28 ENCOUNTER — Telehealth: Payer: Self-pay | Admitting: Cardiology

## 2015-06-28 ENCOUNTER — Encounter: Payer: Self-pay | Admitting: Cardiology

## 2015-06-28 ENCOUNTER — Other Ambulatory Visit: Payer: Self-pay | Admitting: Cardiology

## 2015-06-28 VITALS — BP 142/90 | HR 77 | Ht 67.0 in | Wt 175.0 lb

## 2015-06-28 DIAGNOSIS — R079 Chest pain, unspecified: Secondary | ICD-10-CM | POA: Diagnosis not present

## 2015-06-28 DIAGNOSIS — I493 Ventricular premature depolarization: Secondary | ICD-10-CM | POA: Diagnosis not present

## 2015-06-28 DIAGNOSIS — I10 Essential (primary) hypertension: Secondary | ICD-10-CM | POA: Diagnosis not present

## 2015-06-28 DIAGNOSIS — R002 Palpitations: Secondary | ICD-10-CM | POA: Diagnosis not present

## 2015-06-28 NOTE — Telephone Encounter (Signed)
New Message   Pt wanted to relay to Dr Delton See and her RN that her Kidneys have decreased function and wanted to speak w/ RN about Holter appt. Please call back and discuss.

## 2015-06-28 NOTE — Progress Notes (Signed)
Patient ID: [name] [name], female   DOB: [birth year], 49 y.o.   MRN: [id number]      Cardiology Office Note   Date:  06/28/2015   ID:  [name] [name], DOB June 01, 1966, MRN [id number]  PCP:  No primary care provider on file.  Cardiologist:Kellsey Sansone, Jamse Belfast, MD   Chief complain: Palpitations   History of Present Illness: [name] [name] is a 49 y.o. female who presents for evaluation for palpitations.   The patient is a 49 year old, previously seen by Dr Gwenlyn Found in 2008 and was evaluated for palpitations - Holter showed infrequent PVCs. She also has a nuclear stress test in 2008 that was negative for ischemia, and an echocardiogram that was normal at the time, LVEF 78%, normal diastolic function, no significant valvular abnormalities.  The patient is coming because of concern of worsening palpitations, they are usually brought on by exertion  Like walking stairs but can happen at rest and while laying on the left side. They would last 15-20 minutes and are associated with SOB. They would be followed bya pause.  No syncope. She has sleep apnea but was told by her neurologist that because its borderline she doesn't need to use CPAP.  Past Medical History  Diagnosis Date  . Palpitations 12/24/2006    Echo - EF >55%; flow pattern normal for age; aortic valve mildly sclerotic  . SOB (shortness of breath) 04/20/2012    MET test - exercise limiting factor - chronotropic incompetence (medication), peak VO2 76% of predicted; no EKG ST changes noted at sub-optimal peak HR  . Dyspnea 12/24/2006    B/P MV - normal perfusion in all regions; post stress LV normal in size; EF 63%; no significant ischemia noted  . Palpitations 01/01/2007    30 day event monitor - 69 pt reported events- some PAC and PVCs reported  . Hypertension   . Complex partial seizures   . PVC's (premature ventricular contractions)     Past Surgical History  Procedure Laterality Date  . Cervical disc surgery  12/01/2002    C4-C5  fusion and bone graft  . Abdominal hysterectomy  02/2003  . Bilateral oophorectomy  05/2004     Current Outpatient Prescriptions  Medication Sig Dispense Refill  . acetaminophen (TYLENOL) 500 MG tablet Take 1,000 mg by mouth every 6 (six) hours as needed for pain (pain).    Marland Kitchen aspirin 81 MG tablet Take 81 mg by mouth daily.    . baclofen (LIORESAL) 20 MG tablet Take 20 mg by mouth 2 (two) times daily.    . cyclobenzaprine (FLEXERIL) 10 MG tablet Take 1 tablet (10 mg total) by mouth 2 (two) times daily as needed for muscle spasms. 20 tablet 0  . diltiazem (CARDIZEM LA) 240 MG 24 hr tablet Take 240 mg by mouth 2 (two) times daily.    Marland Kitchen gabapentin (NEURONTIN) 300 MG capsule Take 300-900 mg by mouth 2 (two) times daily. 379m in the morning and 600-9057min the evening    . lamoTRIgine (LAMICTAL) 200 MG tablet Take 400 mg by mouth daily.    . Multiple Vitamins-Iron (MULTI-VITAMIN/IRON) TABS Take 1 tablet by mouth daily.     No current facility-administered medications for this visit.    Allergies:   Fish allergy; Sarna sensitive; Adhesive; and Sulfa antibiotics    Social History:  The patient  reports that she has never smoked. She has never used smokeless tobacco.   Family History:  The patient's family history includes Asthma in her  brother; Breast cancer in her mother; Diabetes in her mother; Healthy in her brother; Hypertension in her mother.    ROS:  Please see the history of present illness.   Otherwise, review of systems are positive for none.   All other systems are reviewed and negative.    PHYSICAL EXAM: VS:  BP 142/90 mmHg  Pulse 77  Ht _0  (1.702 m)  Wt 175 lb (79.379 kg)  BMI 27.40 kg/m2 , BMI Body mass index is 27.4 kg/(m^2). GEN: Well nourished, well developed, in no acute distress HEENT: normal Neck: no JVD, carotid bruits, or masses Cardiac: RRR; no murmurs, rubs, or gallops,no edema  Respiratory:  clear to auscultation bilaterally, normal work of breathing GI:  soft, nontender, nondistended, + BS MS: no deformity or atrophy Skin: warm and dry, no rash Neuro:  Strength and sensation are intact Psych: euthymic mood, full affect   EKG:  EKG is ordered today. The ekg ordered today demonstrates SR, normal    Recent Labs: No results found for requested labs within last 365 days.    Lipid Panel No results found for: CHOL, TRIG, HDL, CHOLHDL, VLDL, LDLCALC, LDLDIRECT    Wt Readings from Last 3 Encounters:  06/28/15 175 lb (79.379 kg)  08/16/13 173 lb (78.472 kg)  07/06/13 174 lb 3.2 oz (79.017 kg)      ASSESSMENT AND PLAN:  1. Palpitations, SOB - we will order a 40 hour Holter monitor and order GXT to imitate her trigger. Request labs from [facility], if no TSH, we will draw.  2. Hypertension - uncontrolled, but she missed her Cardizem today  3. Lipids - in 2012, LDL 106, HDL 86, TG 46, we will obtain this year's ones from [facility].   Orders Placed This Encounter  Procedures  . Exercise Tolerance Test  . Holter monitor - 48 hour  . EKG 12-Lead    Follow up in 6 weeks   Signed, Dorothy Spark, MD  06/28/2015 10:37 AM    [facility] [place], [place], [place]  [address] Phone: [phone]; Fax: [phone]

## 2015-06-28 NOTE — Telephone Encounter (Signed)
Pt calling to inform Dr Delton See that [facility] was to send the pts current lab results to her for further review.  Pt states that in the notes should be her recent lab results, showing that her kidney function has decreased.  Informed the pt that Dr Delton See did receive notes from [facility], and I'm pretty sure her recent lab results were in the notes.  Informed the pt I will still let Dr Delton See know to look at her labs and review her kidney function.  Pt verbalized understanding and agrees with this plan.  Pt also wanted to let our monitor tech know that when she comes in to have her 48 hr holter monitor placed on, she will need to have the sensitive electrodes, for her skin is very sensitive and is intolerant to the normal electrodes.  Informed the pt that I will let our monitor tech know this.

## 2015-06-28 NOTE — Patient Instructions (Signed)
Medication Instructions:   Your physician recommends that you continue on your current medications as directed. Please refer to the Current Medication list given to you today.     Testing/Procedures:  Your physician has recommended that you wear a 48 HOUR holter monitor. Holter monitors are medical devices that record the heart's electrical activity. Doctors most often use these monitors to diagnose arrhythmias. Arrhythmias are problems with the speed or rhythm of the heartbeat. The monitor is a small, portable device. You can wear one while you do your normal daily activities. This is usually used to diagnose what is causing palpitations/syncope (passing out).   Your physician has requested that you have an exercise tolerance test. For further information please visit [email or web address] Please also follow instruction sheet, as given.     Follow-Up:   2 MONTHS WITH DR Delton See

## 2015-06-29 NOTE — Telephone Encounter (Signed)
[name] [name], can we get those labs

## 2015-06-29 NOTE — Telephone Encounter (Signed)
Informed the pt that Dr Delton See would like for her to call and request her labs to be sent to our office for Dr Delton See to review and advise on.  Informed the pt [facility] sent everything but her recent labs.  Informed the pt of our fax and contact information and to tell her PCP office to Attn labs to Dr Delton See and nurse.  Pt verbalized understanding, and agrees with this plan.

## 2015-07-03 ENCOUNTER — Telehealth: Payer: Self-pay | Admitting: Cardiology

## 2015-07-03 NOTE — Telephone Encounter (Signed)
Pt returned call. Informed pt that labs have not been received at this time. Pt verified fax number and states that she will call [facility] again and have them fax the labs over.

## 2015-07-03 NOTE — Telephone Encounter (Signed)
New Message   Pt wants you to call her if you received the lab work and  To confirm that you dont need anything else

## 2015-07-03 NOTE — Telephone Encounter (Signed)
Left message to call back  

## 2015-07-05 ENCOUNTER — Telehealth: Payer: Self-pay | Admitting: Cardiology

## 2015-07-05 ENCOUNTER — Encounter: Payer: Self-pay | Admitting: Cardiovascular Disease

## 2015-07-05 NOTE — Telephone Encounter (Signed)
Informed the pt that we have not received her labs from her PCP yet.  Informed the pt that we confirmed this with our medical records dept who check the intake on both of our fax machines.  Per the pt she states that she will go by her PCP office and pick up her recent lab results and bring them to our office on next Monday 8/22, for she is due to come in the office anyway's to have her holter monitor placed on.  Pt states she will give it to the front desk when she comes in next Monday 8/22 and she will also sign a medical release form to have records requested by our office to her PCP office.  Informed the pt that, that's a good idea and we will follow-up with her after receiving labs and Dr Delton See reviews and advises on them.  Pt verbalized understanding and agrees with this plan.

## 2015-07-05 NOTE — Telephone Encounter (Signed)
New message      Pt calling to see if we have received her lab results from her PCP Please call to discuss

## 2015-07-07 ENCOUNTER — Ambulatory Visit: Payer: Medicare Other | Admitting: Cardiology

## 2015-07-10 ENCOUNTER — Ambulatory Visit (INDEPENDENT_AMBULATORY_CARE_PROVIDER_SITE_OTHER): Payer: Commercial Managed Care - HMO

## 2015-07-10 DIAGNOSIS — I493 Ventricular premature depolarization: Secondary | ICD-10-CM | POA: Diagnosis not present

## 2015-07-10 DIAGNOSIS — I10 Essential (primary) hypertension: Secondary | ICD-10-CM | POA: Diagnosis not present

## 2015-07-10 DIAGNOSIS — R079 Chest pain, unspecified: Secondary | ICD-10-CM | POA: Diagnosis not present

## 2015-07-11 NOTE — Progress Notes (Signed)
Patient ID: [name] [name], female   DOB: 13-Jun-1966, 49 y.o.   MRN: [id number]  Obtained labs from [facility] CMP and TSH normal on 01/19/2015.  Lars Masson 07/11/2015

## 2015-07-11 NOTE — Telephone Encounter (Signed)
Patients labs received by her PCP [facility].  Placed in Dr Lindaann Slough folder for further review and recommendation if necessary, then scan into the system. Will follow-up with the pt if any new recommendations made based on lab results.

## 2015-08-02 ENCOUNTER — Ambulatory Visit: Payer: Medicare Other | Admitting: Physician Assistant

## 2015-08-09 ENCOUNTER — Other Ambulatory Visit: Payer: Self-pay | Admitting: General Practice

## 2015-08-09 DIAGNOSIS — M79602 Pain in left arm: Secondary | ICD-10-CM

## 2015-08-10 ENCOUNTER — Ambulatory Visit
Admission: RE | Admit: 2015-08-10 | Discharge: 2015-08-10 | Disposition: A | Discharge status: Home / Self Care | Payer: Commercial Managed Care - HMO | Source: Ambulatory Visit | Attending: General Practice | Admitting: General Practice

## 2015-08-10 DIAGNOSIS — M79602 Pain in left arm: Secondary | ICD-10-CM

## 2015-08-11 ENCOUNTER — Ambulatory Visit: Payer: Medicare Other | Admitting: Physician Assistant

## 2015-08-31 ENCOUNTER — Encounter: Payer: Commercial Managed Care - HMO | Admitting: Physician Assistant

## 2015-08-31 ENCOUNTER — Encounter: Payer: Medicare Other | Admitting: Physician Assistant

## 2015-08-31 ENCOUNTER — Ambulatory Visit (INDEPENDENT_AMBULATORY_CARE_PROVIDER_SITE_OTHER): Payer: Commercial Managed Care - HMO

## 2015-08-31 DIAGNOSIS — R079 Chest pain, unspecified: Secondary | ICD-10-CM

## 2015-08-31 DIAGNOSIS — I10 Essential (primary) hypertension: Secondary | ICD-10-CM | POA: Diagnosis not present

## 2015-08-31 DIAGNOSIS — I493 Ventricular premature depolarization: Secondary | ICD-10-CM | POA: Diagnosis not present

## 2015-08-31 LAB — EXERCISE TOLERANCE TEST
Estimated workload: 8.5 METS
Exercise duration (min): 7 min
MPHR: 170 {beats}/min
Peak HR: 125 {beats}/min
Percent HR: 73 %
RPE: 17
Rest HR: 63 {beats}/min

## 2015-09-04 ENCOUNTER — Encounter: Payer: Self-pay | Admitting: Cardiology

## 2015-09-04 ENCOUNTER — Ambulatory Visit (INDEPENDENT_AMBULATORY_CARE_PROVIDER_SITE_OTHER): Payer: Commercial Managed Care - HMO | Admitting: Cardiology

## 2015-09-04 ENCOUNTER — Ambulatory Visit: Payer: Medicare Other | Admitting: Cardiology

## 2015-09-04 VITALS — BP 134/82 | HR 62 | Ht 67.0 in | Wt 175.0 lb

## 2015-09-04 DIAGNOSIS — R002 Palpitations: Secondary | ICD-10-CM

## 2015-09-04 DIAGNOSIS — I10 Essential (primary) hypertension: Secondary | ICD-10-CM | POA: Diagnosis not present

## 2015-09-04 NOTE — Patient Instructions (Signed)
Your physician recommends that you continue on your current medications as directed. Please refer to the Current Medication list given to you today.    Your physician wants you to follow-up in: 2 YEARS WITH DR [name] You will receive a reminder letter in the mail two months in advance. If you don't receive a letter, please call our office to schedule the follow-up appointment.  

## 2015-09-04 NOTE — Progress Notes (Signed)
Patient ID: [name] [name], female   DOB: 08-Dec-1965, 48 y.o.   MRN: [id number]      Cardiology Office Note   Date:  09/04/2015   ID:  [name] [name], DOB [birth year], MRN [id number]  PCP:  Adline Mango, MD  Cardiologist:Shay Bartoli, Jamse Belfast, MD   Chief complain: Palpitations   History of Present Illness: [name] [name] is a 49 y.o. female who presents for evaluation for palpitations.   The patient is a 49 year old, previously seen by Dr Gwenlyn Found in 2008 and was evaluated for palpitations - Holter showed infrequent PVCs. She also has a nuclear stress test in 2008 that was negative for ischemia, and an echocardiogram that was normal at the time, LVEF 97%, normal diastolic function, no significant valvular abnormalities.  The patient is coming because of concern of worsening palpitations, they are usually brought on by exertion  Like walking stairs but can happen at rest and while laying on the left side. They would last 15-20 minutes and are associated with SOB. They would be followed by a pause.  No syncope. She has sleep apnea but was told by her neurologist that because its borderline she doesn't need to use CPAP.  09/04/2015 - follow up, she feels better, less frequent palpitations with increased diltiazem, no CP, DOE or syncope. Holter monitor showed only few PACs, otherwise completely normal. TSH normal.  Past Medical History  Diagnosis Date  . Palpitations 12/24/2006    Echo - EF >55%; flow pattern normal for age; aortic valve mildly sclerotic  . SOB (shortness of breath) 04/20/2012    MET test - exercise limiting factor - chronotropic incompetence (medication), peak VO2 76% of predicted; no EKG ST changes noted at sub-optimal peak HR  . Dyspnea 12/24/2006    B/P MV - normal perfusion in all regions; post stress LV normal in size; EF 63%; no significant ischemia noted  . Palpitations 01/01/2007    30 day event monitor - 69 pt reported events- some PAC and PVCs reported  . Hypertension   .  Complex partial seizures ([place])   . PVC's (premature ventricular contractions)     Past Surgical History  Procedure Laterality Date  . Cervical disc surgery  12/01/2002    C4-C5 fusion and bone graft  . Abdominal hysterectomy  02/2003  . Bilateral oophorectomy  05/2004     Current Outpatient Prescriptions  Medication Sig Dispense Refill  . acetaminophen (TYLENOL) 500 MG tablet Take 1,000 mg by mouth every 6 (six) hours as needed for pain (pain).    Marland Kitchen aspirin 81 MG tablet Take 81 mg by mouth daily.    . baclofen (LIORESAL) 20 MG tablet Take 20 mg by mouth daily.     . cyclobenzaprine (FLEXERIL) 10 MG tablet Take 1 tablet (10 mg total) by mouth 2 (two) times daily as needed for muscle spasms. 20 tablet 0  . diltiazem (CARDIZEM LA) 240 MG 24 hr tablet Take 240 mg by mouth 2 (two) times daily.    Marland Kitchen gabapentin (NEURONTIN) 300 MG capsule Take 300-900 mg by mouth 2 (two) times daily. 314m in the morning and 600-9056min the evening    . lamoTRIgine (LAMICTAL) 200 MG tablet Take 200 mg by mouth 2 (two) times daily.     . Multiple Vitamins-Iron (MULTI-VITAMIN/IRON) TABS Take 1 tablet by mouth daily.    . ranitidine (ZANTAC) 150 MG tablet Take 150 mg by mouth 2 (two) times daily.      No current facility-administered medications for this  visit.    Allergies:   Fish allergy; Sarna sensitive; Adhesive; and Sulfa antibiotics    Social History:  The patient  reports that she has never smoked. She has never used smokeless tobacco.   Family History:  The patient's family history includes Asthma in her brother; Breast cancer in her mother; Diabetes in her mother; Healthy in her brother; Hypertension in her mother.    ROS:  Please see the history of present illness.   Otherwise, review of systems are positive for none.   All other systems are reviewed and negative.    PHYSICAL EXAM: VS:  BP 134/82 mmHg  Pulse 62  Ht '5\' 7"'  (1.702 m)  Wt 175 lb (79.379 kg)  BMI 27.40 kg/m2  SpO2 99% , BMI Body  mass index is 27.4 kg/(m^2). GEN: Well nourished, well developed, in no acute distress HEENT: normal Neck: no JVD, carotid bruits, or masses Cardiac: RRR; no murmurs, rubs, or gallops,no edema  Respiratory:  clear to auscultation bilaterally, normal work of breathing GI: soft, nontender, nondistended, + BS MS: no deformity or atrophy Skin: warm and dry, no rash Neuro:  Strength and sensation are intact Psych: euthymic mood, full affect   EKG:  EKG is ordered today. The ekg ordered today demonstrates SR, normal    Recent Labs: No results found for requested labs within last 365 days.    Lipid Panel No results found for: CHOL, TRIG, HDL, CHOLHDL, VLDL, LDLCALC, LDLDIRECT    Wt Readings from Last 3 Encounters:  09/04/15 175 lb (79.379 kg)  06/28/15 175 lb (79.379 kg)  08/16/13 173 lb (78.472 kg)      ASSESSMENT AND PLAN:  1. Palpitations, SOB - Holter monitor showed only few PACs, otherwise completely normal.  GXT normal, no ischemia, no arrhythmias. Normal TSH.  2. Hypertension - controlled after increasing Cardizem to 240 mg po daily.  3. Lipids - in 2012, LDL 106, HDL 86, TG 46.  Follow up in 2 years.  Signed, Dorothy Spark, MD  09/04/2015 4:23 PM    [facility] [place], [place], [place]  [address] Phone: [phone]; Fax: [phone]

## 2015-09-22 ENCOUNTER — Telehealth: Payer: Self-pay | Admitting: Cardiology

## 2015-09-22 NOTE — Telephone Encounter (Signed)
Pt calling to inform Dr Delton SeeNelson and myself that she took her 1st dose of diltiazem 240 mg po this morning around 6 am, and then accidentally forgot she took this medication, and took it again a 8 am.  Pt reports she is completely fine and asymptomatic.  Pt states she is a retired Engineer, civil (consulting)nurse and is constantly monitoring her HR and BP since this event occurred.  Pt states her last 2 HR and BP readings were:  119/69, HR- 67 and 124/67 HR-68. Pt has no complaints of dizziness, sob, syncopal or pre-syncopal episodes at this time.  Pt states she has continued on with her usual morning routine with no difficulties, since making this mistake.  Pt states she contacted Poison control and her local Pharmacy, and both instructed the pt to continue monitoring her BP and HR, and drink plenty of fluids.  Both instructed the pt if her VS drop and she becomes dizzy and syncopal, then she should seek immediate medical attention.  Advised the pt that she should follow the instructions provided by her Pharmacy and Poison Control, and hold her afternoon dose of diltiazem 240mg , for she takes this usually  twice daily.  Pt verbalized understanding and agrees with this plan.  Will route this message to Dr Delton SeeNelson as an Lorain ChildesFYI.

## 2015-09-22 NOTE — Telephone Encounter (Signed)
Pt c/o medication issue:  1. Name of Medication: Diltiazem  240mg   2. How are you currently taking this medication (dosage and times per day)? 2x a day  3. Are you having a reaction (difficulty breathing--STAT)? No  4. What is your medication issue? Pt took 1st dose at 6:20am 240mg  of diltiazem and 8am she took another 240mg  of dilitiazem by accident. Please advise

## 2015-10-26 ENCOUNTER — Other Ambulatory Visit: Payer: Self-pay

## 2015-10-26 DIAGNOSIS — Z1231 Encounter for screening mammogram for malignant neoplasm of breast: Secondary | ICD-10-CM

## 2015-11-23 ENCOUNTER — Ambulatory Visit
Admission: RE | Admit: 2015-11-23 | Discharge: 2015-11-23 | Disposition: A | Discharge status: Home / Self Care | Payer: Commercial Managed Care - HMO | Source: Ambulatory Visit

## 2015-11-23 DIAGNOSIS — Z1231 Encounter for screening mammogram for malignant neoplasm of breast: Secondary | ICD-10-CM

## 2016-01-16 DIAGNOSIS — I129 Hypertensive chronic kidney disease with stage 1 through stage 4 chronic kidney disease, or unspecified chronic kidney disease: Secondary | ICD-10-CM | POA: Insufficient documentation

## 2016-01-16 DIAGNOSIS — Z791 Long term (current) use of non-steroidal anti-inflammatories (NSAID): Secondary | ICD-10-CM | POA: Insufficient documentation

## 2016-01-16 DIAGNOSIS — N182 Chronic kidney disease, stage 2 (mild): Secondary | ICD-10-CM

## 2016-01-26 DIAGNOSIS — M25561 Pain in right knee: Secondary | ICD-10-CM | POA: Insufficient documentation

## 2016-01-26 DIAGNOSIS — M75102 Unspecified rotator cuff tear or rupture of left shoulder, not specified as traumatic: Secondary | ICD-10-CM | POA: Insufficient documentation

## 2016-04-09 DIAGNOSIS — M5412 Radiculopathy, cervical region: Secondary | ICD-10-CM | POA: Insufficient documentation

## 2016-04-09 DIAGNOSIS — M5416 Radiculopathy, lumbar region: Secondary | ICD-10-CM | POA: Insufficient documentation

## 2016-04-09 DIAGNOSIS — G43009 Migraine without aura, not intractable, without status migrainosus: Secondary | ICD-10-CM | POA: Insufficient documentation

## 2016-04-09 DIAGNOSIS — M542 Cervicalgia: Secondary | ICD-10-CM | POA: Insufficient documentation

## 2016-04-09 DIAGNOSIS — G40802 Other epilepsy, not intractable, without status epilepticus: Secondary | ICD-10-CM | POA: Insufficient documentation

## 2016-04-09 DIAGNOSIS — R42 Dizziness and giddiness: Secondary | ICD-10-CM | POA: Insufficient documentation

## 2016-04-09 DIAGNOSIS — M5481 Occipital neuralgia: Secondary | ICD-10-CM | POA: Insufficient documentation

## 2016-04-09 DIAGNOSIS — G40209 Localization-related (focal) (partial) symptomatic epilepsy and epileptic syndromes with complex partial seizures, not intractable, without status epilepticus: Secondary | ICD-10-CM | POA: Insufficient documentation

## 2016-04-09 DIAGNOSIS — M545 Low back pain, unspecified: Secondary | ICD-10-CM | POA: Insufficient documentation

## 2016-04-09 DIAGNOSIS — M2550 Pain in unspecified joint: Secondary | ICD-10-CM | POA: Insufficient documentation

## 2016-04-09 DIAGNOSIS — G4733 Obstructive sleep apnea (adult) (pediatric): Secondary | ICD-10-CM | POA: Insufficient documentation

## 2016-07-10 DIAGNOSIS — M898X9 Other specified disorders of bone, unspecified site: Secondary | ICD-10-CM | POA: Insufficient documentation

## 2016-07-10 DIAGNOSIS — E889 Metabolic disorder, unspecified: Secondary | ICD-10-CM | POA: Insufficient documentation

## 2016-07-10 DIAGNOSIS — M9080 Osteopathy in diseases classified elsewhere, unspecified site: Secondary | ICD-10-CM

## 2016-07-10 DIAGNOSIS — E559 Vitamin D deficiency, unspecified: Secondary | ICD-10-CM | POA: Insufficient documentation

## 2016-08-08 ENCOUNTER — Telehealth: Payer: Self-pay | Admitting: Cardiology

## 2016-08-08 NOTE — Telephone Encounter (Signed)
New Message  Pt call requesting to speak with RN to f/u on Echo she states she was to receive a call to be schedule. Pt asked to speak with RN to schedule. Please call back to discuss

## 2016-08-08 NOTE — Telephone Encounter (Signed)
Pt calling to see if her PCP has sent over a faxed order for the pt to have an echo done at our office and to be read by Dr Delton SeeNelson.  Pt states her PCP Dr. Kristen LoaderFurr is doing a work-up on her, and ordered for the pt to have an echo done at our office.  Informed the pt that we have not received an electronic order or a faxed order for her to have an echo done.  Informed the pt that typically if PCP wants to rule out any cardiac issues, they will advise for them to see their General Cardiologist for a follow-up appt, and then appropriate testing will be ordered then.  Advised the pt to touch base with her PCP tomorrow and have them fax a note and order to our office at [phone] and [facility] and Dr [facility] See[facility]elson.  Advised the pt to have her PCP provide a diagnosis to go with the echo order.  Informed the pt that we can do the echo here in our office, but will need to have an order from Dr Kristen LoaderFurr first, so this can be scheduled and read accordingly by Dr Delton SeeNelson.  Pt verbalized understanding and agrees with this plan.  Pt gracious for all the assistance provided.

## 2016-08-14 NOTE — Telephone Encounter (Signed)
Pt has not returned a call back, in regards to her PCP faxing orders to our office for further testing.  Will close this encounter and addend this as needed, when the pt calls back or PCP faxes orders.

## 2016-08-15 ENCOUNTER — Other Ambulatory Visit: Payer: Self-pay | Admitting: Internal Medicine

## 2016-08-15 DIAGNOSIS — R0602 Shortness of breath: Secondary | ICD-10-CM

## 2016-08-27 ENCOUNTER — Other Ambulatory Visit (HOSPITAL_COMMUNITY): Payer: Commercial Managed Care - HMO

## 2016-09-05 ENCOUNTER — Encounter: Payer: Self-pay | Admitting: Cardiology

## 2016-09-05 LAB — PULMONARY FUNCTION TEST

## 2016-09-16 ENCOUNTER — Ambulatory Visit (HOSPITAL_COMMUNITY): Payer: Commercial Managed Care - HMO | Attending: Internal Medicine

## 2016-09-16 ENCOUNTER — Other Ambulatory Visit: Payer: Self-pay

## 2016-09-16 DIAGNOSIS — I071 Rheumatic tricuspid insufficiency: Secondary | ICD-10-CM | POA: Insufficient documentation

## 2016-09-16 DIAGNOSIS — R0602 Shortness of breath: Secondary | ICD-10-CM | POA: Insufficient documentation

## 2016-09-16 DIAGNOSIS — I10 Essential (primary) hypertension: Secondary | ICD-10-CM | POA: Diagnosis not present

## 2016-11-28 ENCOUNTER — Other Ambulatory Visit: Payer: Self-pay | Admitting: Internal Medicine

## 2016-11-28 DIAGNOSIS — Z1231 Encounter for screening mammogram for malignant neoplasm of breast: Secondary | ICD-10-CM

## 2016-12-02 ENCOUNTER — Other Ambulatory Visit: Payer: Self-pay | Admitting: Internal Medicine

## 2016-12-02 DIAGNOSIS — N644 Mastodynia: Secondary | ICD-10-CM

## 2016-12-09 ENCOUNTER — Other Ambulatory Visit: Payer: Commercial Managed Care - HMO

## 2016-12-16 ENCOUNTER — Ambulatory Visit
Admission: RE | Admit: 2016-12-16 | Discharge: 2016-12-16 | Disposition: A | Discharge status: Home / Self Care | Payer: Commercial Managed Care - HMO | Source: Ambulatory Visit | Attending: Internal Medicine | Admitting: Internal Medicine

## 2016-12-16 DIAGNOSIS — N644 Mastodynia: Secondary | ICD-10-CM

## 2016-12-18 DIAGNOSIS — R1319 Other dysphagia: Secondary | ICD-10-CM | POA: Insufficient documentation

## 2016-12-18 DIAGNOSIS — R0989 Other specified symptoms and signs involving the circulatory and respiratory systems: Secondary | ICD-10-CM | POA: Insufficient documentation

## 2016-12-20 ENCOUNTER — Telehealth: Payer: Self-pay | Admitting: Cardiology

## 2016-12-20 NOTE — Telephone Encounter (Signed)
ROI mailed to pt home address.  °

## 2017-03-25 ENCOUNTER — Telehealth: Payer: Self-pay | Admitting: Cardiology

## 2017-03-25 NOTE — Telephone Encounter (Signed)
New Message   pt verbalized that she is calling for rn   She wants a call back today   She verbalized that her troponin levels are 0.040

## 2017-03-25 NOTE — Telephone Encounter (Signed)
Spoke with the pt, she states that clarification needs to be provided that her PCP informed her troponin was less than 0.06, but still advised her to follow with her Cardiologist, for cardiac complaints mentioned at that appt.  Pt would like to keep her appt scheduled for tomorrow with Boyce MediciBrittany Simmons at [id number], for PCP advised her too.  Pt just wanted to provide additional information.

## 2017-03-25 NOTE — Telephone Encounter (Signed)
Pt calling to schedule an appt with Dr Lindaann SloughNelson's PA for tomorrow, as advised by her PCP.  Pt states she saw her PCP for complaints of abdominal issues, constipation, chest pain, and N/V.  Pt states her PCP drew a cmet, cbc w diff, CKMB, and Troponin level.  Pt states everything came back within acceptable limits, but her troponin came back at 0.40.  Unable to view pts current lab results through [facility] or care everywhere, for the pt states her PCP is a stand alone.  Pt states her PCP advised for her to call her Cardiologist and obtain an appt sometime in the near future.  Pt states that her PCP also informed her that she was constipated, and advised on that as well.  Pt calling to obtain an appt for tomorrow.  Pt is scheduled to see Boyce MediciBrittany Simmons PA-C for tomorrow 03/26/17 at 1130.  Pt aware to arrive 20 mins prior too this appt.  Advised the pt of our fax number and to contact her PCP office now to have them fax records and recent lab results too [phone] and have them attention too:  Boyce MediciBrittany Simmons PA-C.  Informed the pt that this way she will have baseline labs to compare.   Advised the pt that if she develops worsening symptoms between now and tomorrow, then she should refer to the ER. Informed the pt that Dr Delton SeeNelson is out of the office but I will route this message to her as an FYI.  Pt verbalized understanding and agrees with this plan.

## 2017-03-25 NOTE — Telephone Encounter (Signed)
New Message    Pt states that she was told wrong , the results she was given are not urgent

## 2017-03-26 ENCOUNTER — Encounter: Payer: Self-pay | Admitting: Cardiology

## 2017-03-26 ENCOUNTER — Ambulatory Visit (INDEPENDENT_AMBULATORY_CARE_PROVIDER_SITE_OTHER): Payer: Commercial Managed Care - HMO | Admitting: Cardiology

## 2017-03-26 VITALS — BP 122/72 | HR 61 | Ht 67.0 in | Wt 166.0 lb

## 2017-03-26 DIAGNOSIS — R079 Chest pain, unspecified: Secondary | ICD-10-CM

## 2017-03-26 DIAGNOSIS — R0600 Dyspnea, unspecified: Secondary | ICD-10-CM | POA: Diagnosis not present
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
## 2017-03-26 NOTE — Patient Instructions (Signed)
Medication Instructions:    Your physician recommends that you continue on your current medications as directed. Please refer to the Current Medication list given to you today.   If you need a refill on your cardiac medications before your next appointment, please call your pharmacy.  Labwork: NONE ORDERED  TODAY    Testing/Procedures: Cardiac CT scanning, (CAT scanning), is a noninvasive, special x-ray that produces cross-sectional images of the body using x-rays and a computer. CT scans help physicians diagnose and treat medical conditions. For some CT exams, a contrast material is used to enhance visibility in the area of the body being studied. CT scans provide greater clarity and reveal more details than regular x-ray exams.     Follow-Up IN 3 TO 4 WEEKS WITH [name] OR SIMMONS    Any Other Special Instructions Will Be Listed Below (If Applicable).

## 2017-04-02 ENCOUNTER — Telehealth: Payer: Self-pay | Admitting: Cardiology

## 2017-04-02 NOTE — Telephone Encounter (Signed)
Dr. Delton SeeNelson,  [facility] [name] ordered Cardiac CT for Chest Pain and Exertional Dyspnea.   See office note 03-26-17:  Chest Pain and Exertional Dyspnea: recent CBC showed normal Hgb at 13. 2D echo 08/2016 showed normal LVEF and valve function. EKG 03/20/17 at PCP office showed mild sinus bradycardia but no other abnormalities. She has had 2 stress test in the past, a NST in 2008 and ETT in 2016, both of which were normal. Her only cardiac risk factor is HTN, which is controlled. We've discussed cardiac testing options to r/o ischemia and to assess overall cardiac risk. We will plan for coronary CTA with calcium score to r/o CAD/ischemia. F/u in 3-4 weeks after study with Dr. Delton SeeNelson or myself.   Patient has follow up appointment with Sharol HarnessSimmons on 04-25-17.  Please review and advise.

## 2017-04-03 NOTE — Telephone Encounter (Signed)
Will send our CT scheduler a message that per Dr Delton SeeNelson, please arrange for this pts coronary ct to be done, and the ok has been provided.  Omar PersonSharon Ferguson to follow-up with the pt on scheduling cardiac ct.

## 2017-04-03 NOTE — Telephone Encounter (Signed)
Please go ahead and order coronary CTA. Tobias AlexanderKatarina Raelan Burgoon

## 2017-04-04 ENCOUNTER — Encounter: Payer: Self-pay | Admitting: Cardiology

## 2017-04-11 ENCOUNTER — Ambulatory Visit (HOSPITAL_COMMUNITY): Payer: Commercial Managed Care - HMO

## 2017-04-11 ENCOUNTER — Ambulatory Visit (HOSPITAL_COMMUNITY)
Admission: RE | Admit: 2017-04-11 | Discharge: 2017-04-11 | Disposition: A | Discharge status: Home / Self Care | Payer: 59 | Source: Ambulatory Visit | Attending: Cardiology | Admitting: Cardiology

## 2017-04-11 DIAGNOSIS — R079 Chest pain, unspecified: Secondary | ICD-10-CM | POA: Insufficient documentation

## 2017-04-11 DIAGNOSIS — I700 Atherosclerosis of aorta: Secondary | ICD-10-CM | POA: Diagnosis not present

## 2017-04-11 DIAGNOSIS — R0600 Dyspnea, unspecified: Secondary | ICD-10-CM | POA: Insufficient documentation

## 2017-04-11 LAB — POCT I-STAT CREATININE: Creatinine, Ser: 1.3 mg/dL — ABNORMAL HIGH (ref 0.44–1.00)

## 2017-04-11 MED ORDER — METOPROLOL TARTRATE 5 MG/5ML IV SOLN
INTRAVENOUS | Status: AC
  Administered 2017-04-11: 5 mg
  Filled 2017-04-11: qty 5

## 2017-04-11 MED ORDER — METOPROLOL TARTRATE 5 MG/5ML IV SOLN
INTRAVENOUS | Status: AC
  Filled 2017-04-11: qty 5

## 2017-04-11 MED ORDER — METOPROLOL TARTRATE 5 MG/5ML IV SOLN
5.0000 mg | Freq: Once | INTRAVENOUS | Status: DC
  Filled 2017-04-11: qty 5

## 2017-04-11 MED ORDER — SODIUM CHLORIDE 0.9 % IV SOLN
Freq: Once | INTRAVENOUS | Status: AC
  Administered 2017-04-11: 500 mL/h via INTRAVENOUS

## 2017-04-11 MED ORDER — NITROGLYCERIN 0.4 MG SL SUBL
0.8000 mg | SUBLINGUAL_TABLET | Freq: Once | SUBLINGUAL | Status: AC
  Administered 2017-04-11: 0.8 mg via SUBLINGUAL

## 2017-04-11 MED ORDER — IOPAMIDOL (ISOVUE-370) INJECTION 76%
INTRAVENOUS | Status: AC
  Administered 2017-04-11: 100 mL
  Filled 2017-04-11: qty 100

## 2017-04-11 MED ORDER — METOPROLOL TARTRATE 5 MG/5ML IV SOLN
5.0000 mg | Freq: Once | INTRAVENOUS | Status: AC
  Administered 2017-04-11: 5 mg via INTRAVENOUS

## 2017-04-11 MED ORDER — NITROGLYCERIN 0.4 MG SL SUBL
SUBLINGUAL_TABLET | SUBLINGUAL | Status: DC
Start: 2017-04-11 — End: 2017-04-12
  Filled 2017-04-11: qty 2

## 2017-04-11 NOTE — Progress Notes (Signed)
CT scan completed. Tolerated well. D/C home in wheelchair with husband. Awake and alert. In no distress. 

## 2017-04-25 ENCOUNTER — Ambulatory Visit: Payer: Commercial Managed Care - HMO | Admitting: Cardiology

## 2017-05-13 DIAGNOSIS — R053 Chronic cough: Secondary | ICD-10-CM | POA: Insufficient documentation

## 2017-05-13 DIAGNOSIS — R042 Hemoptysis: Secondary | ICD-10-CM | POA: Insufficient documentation

## 2017-05-13 DIAGNOSIS — K2100 Gastro-esophageal reflux disease with esophagitis, without bleeding: Secondary | ICD-10-CM | POA: Insufficient documentation

## 2017-07-04 ENCOUNTER — Emergency Department (HOSPITAL_COMMUNITY)
Admission: EM | Admit: 2017-07-04 | Discharge: 2017-07-04 | Disposition: A | Discharge status: Home / Self Care | Payer: 59 | Attending: Emergency Medicine | Admitting: Emergency Medicine

## 2017-07-04 ENCOUNTER — Emergency Department (HOSPITAL_COMMUNITY): Payer: 59

## 2017-07-04 ENCOUNTER — Telehealth: Payer: Self-pay | Admitting: Cardiology

## 2017-07-04 ENCOUNTER — Encounter (HOSPITAL_COMMUNITY): Payer: Self-pay | Admitting: *Deleted

## 2017-07-04 DIAGNOSIS — R079 Chest pain, unspecified: Secondary | ICD-10-CM | POA: Insufficient documentation

## 2017-07-04 DIAGNOSIS — Z5321 Procedure and treatment not carried out due to patient leaving prior to being seen by health care provider: Secondary | ICD-10-CM | POA: Diagnosis not present

## 2017-07-04 LAB — CBC
HEMATOCRIT: 38.6 % (ref 36.0–46.0)
HEMOGLOBIN: 12.9 g/dL (ref 12.0–15.0)
MCH: 30.6 pg (ref 26.0–34.0)
MCHC: 33.4 g/dL (ref 30.0–36.0)
MCV: 91.5 fL (ref 78.0–100.0)
Platelets: 284 10*3/uL (ref 150–400)
RBC: 4.22 MIL/uL (ref 3.87–5.11)
RDW: 12.6 % (ref 11.5–15.5)
WBC: 7.3 10*3/uL (ref 4.0–10.5)

## 2017-07-04 LAB — BASIC METABOLIC PANEL
ANION GAP: 12 (ref 5–15)
BUN: 12 mg/dL (ref 6–20)
CHLORIDE: 104 mmol/L (ref 101–111)
CO2: 24 mmol/L (ref 22–32)
Calcium: 9.7 mg/dL (ref 8.9–10.3)
Creatinine, Ser: 1.31 mg/dL — ABNORMAL HIGH (ref 0.44–1.00)
GFR calc Af Amer: 53 mL/min — ABNORMAL LOW (ref >60)
GFR calc non Af Amer: 46 mL/min — ABNORMAL LOW (ref >60)
Glucose, Bld: 109 mg/dL — ABNORMAL HIGH (ref 65–99)
POTASSIUM: 3.6 mmol/L (ref 3.5–5.1)
SODIUM: 140 mmol/L (ref 135–145)

## 2017-07-04 LAB — I-STAT TROPONIN, ED: Troponin i, poc: 0 ng/mL (ref 0.00–0.08)

## 2017-07-04 NOTE — Telephone Encounter (Signed)
New message   Pt c/o of Chest Pain: STAT if CP now or developed within 24 hours  1. Are you having CP right now? Yes only in right spot  2. Are you experiencing any other symptoms (ex. SOB, nausea, vomiting, sweating)? Some nausea nothing serious  3. How long have you been experiencing CP? Has been off and on  4. Is your CP continuous or coming and going?coming and going  5. Have you taken Nitroglycerin? No she took aspirin ?

## 2017-07-04 NOTE — ED Triage Notes (Signed)
Pt in c/o R Sided non radiating CP onset x 24 hrs ago, pt reports SOB, denies n/v/d, pt A&O x4

## 2017-07-04 NOTE — Telephone Encounter (Signed)
Pt is calling in with active chest pain, localized to the right side of her chest, accompanied with N/V and some sob.  Pt requesting to be seen by a Provider in our office today. Informed the pt that given her complaints of active chest pain, accompanied with other cardiac related issues and having no availability in our office this afternoon, she should refer to [facility] ER for further evaluation.  Informed the pt that this is safe practice as well, given her active complaints. Informed the pt that she will need appropriate testing to be done in the ER, to further eval.  Advised the pt to report to [facility] ER now.  Pt verbalized understanding and agreed to this plan.  Pt states that she will have her Husband take her private vehicle now. Informed the pt that I will route this message to Dr Delton See as an Lorain Childes.  Pt agreed to plan.
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
## 2017-07-07 NOTE — Telephone Encounter (Signed)
Pt went to the ER last Friday, as instructed based on cardiac complaints.  Pt had labs, EKG, and chest X-Ray done.  All test appeared normal, but she wanted to make sure Dr Delton See took a look at all her tests she had done.  Informed the pt that Dr Delton See is out of the office today, but I will route this message to her, so she can review test done in the hospital, upon return to the office.  Pt states she is feeling much better and is asymptomatic from a cardiac basis.  Pt verbalized understanding and agrees with this plan.

## 2017-07-08 NOTE — Telephone Encounter (Signed)
She had normal CBC, troponin, Crea mildly elevated, she should increase fluid intake

## 2017-07-08 NOTE — Telephone Encounter (Signed)
Pt aware that Dr Delton See reviewed her test from the ER, and noted that she had a normal cbc, normal troponin, mildly elevated creatinine, and she recommends that the pt increase her fluid intake. Pt verbalized understanding and agrees with this plan.

## 2017-09-16 DIAGNOSIS — R490 Dysphonia: Secondary | ICD-10-CM | POA: Insufficient documentation

## 2017-10-20 ENCOUNTER — Encounter: Payer: Self-pay | Admitting: Cardiology

## 2017-11-03 ENCOUNTER — Ambulatory Visit: Payer: 59 | Admitting: Cardiology

## 2017-11-04 ENCOUNTER — Other Ambulatory Visit: Payer: Self-pay | Admitting: Internal Medicine

## 2017-11-04 DIAGNOSIS — Z1231 Encounter for screening mammogram for malignant neoplasm of breast: Secondary | ICD-10-CM

## 2017-12-17 ENCOUNTER — Ambulatory Visit
Admission: RE | Admit: 2017-12-17 | Discharge: 2017-12-17 | Disposition: A | Discharge status: Home / Self Care | Payer: 59 | Source: Ambulatory Visit | Attending: Internal Medicine | Admitting: Internal Medicine

## 2017-12-17 DIAGNOSIS — Z1231 Encounter for screening mammogram for malignant neoplasm of breast: Secondary | ICD-10-CM

## 2017-12-18 ENCOUNTER — Other Ambulatory Visit: Payer: Self-pay | Admitting: Internal Medicine

## 2017-12-18 DIAGNOSIS — N644 Mastodynia: Secondary | ICD-10-CM

## 2017-12-18 DIAGNOSIS — N3942 Incontinence without sensory awareness: Secondary | ICD-10-CM | POA: Insufficient documentation

## 2017-12-23 ENCOUNTER — Other Ambulatory Visit: Payer: Self-pay | Admitting: Internal Medicine

## 2017-12-23 DIAGNOSIS — N644 Mastodynia: Secondary | ICD-10-CM

## 2018-01-02 ENCOUNTER — Other Ambulatory Visit: Payer: 59

## 2018-01-15 DIAGNOSIS — M5030 Other cervical disc degeneration, unspecified cervical region: Secondary | ICD-10-CM | POA: Insufficient documentation

## 2018-01-23 ENCOUNTER — Ambulatory Visit (INDEPENDENT_AMBULATORY_CARE_PROVIDER_SITE_OTHER): Payer: 59 | Admitting: Cardiology

## 2018-01-23 ENCOUNTER — Encounter: Payer: Self-pay | Admitting: Cardiology

## 2018-01-23 VITALS — BP 150/98 | HR 75 | Ht 67.0 in | Wt 178.0 lb

## 2018-01-23 DIAGNOSIS — R0609 Other forms of dyspnea: Secondary | ICD-10-CM

## 2018-01-23 DIAGNOSIS — I10 Essential (primary) hypertension: Secondary | ICD-10-CM

## 2018-01-23 DIAGNOSIS — R002 Palpitations: Secondary | ICD-10-CM | POA: Diagnosis not present
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
## 2018-01-23 NOTE — Patient Instructions (Signed)
Medication Instructions:   Your physician recommends that you continue on your current medications as directed. Please refer to the Current Medication list given to you today.   Testing/Procedures:  Your physician has recommended that you wear a 48 HOUR holter monitor. Holter monitors are medical devices that record the heart's electrical activity. Doctors most often use these monitors to diagnose arrhythmias. Arrhythmias are problems with the speed or rhythm of the heartbeat. The monitor is a small, portable device. You can wear one while you do your normal daily activities. This is usually used to diagnose what is causing palpitations/syncope (passing out).    Follow-Up:  Your physician wants you to follow-up in: 6 MONTHS WITH DR Johnell ComingsNELSON You will receive a reminder letter in the mail two months in advance. If you don't receive a letter, please call our office to schedule the follow-up appointment.        If you need a refill on your cardiac medications before your next appointment, please call your pharmacy.

## 2018-02-03 ENCOUNTER — Ambulatory Visit
Admission: RE | Admit: 2018-02-03 | Discharge: 2018-02-03 | Disposition: A | Discharge status: Home / Self Care | Payer: 59 | Source: Ambulatory Visit | Attending: Internal Medicine | Admitting: Internal Medicine

## 2018-02-03 ENCOUNTER — Ambulatory Visit: Payer: 59

## 2018-02-03 DIAGNOSIS — N644 Mastodynia: Secondary | ICD-10-CM

## 2018-02-09 ENCOUNTER — Ambulatory Visit (INDEPENDENT_AMBULATORY_CARE_PROVIDER_SITE_OTHER): Payer: 59

## 2018-02-09 DIAGNOSIS — R0609 Other forms of dyspnea: Secondary | ICD-10-CM | POA: Diagnosis not present

## 2018-02-09 DIAGNOSIS — R002 Palpitations: Secondary | ICD-10-CM

## 2018-02-10 ENCOUNTER — Other Ambulatory Visit: Payer: 59

## 2018-02-11 ENCOUNTER — Telehealth: Payer: Self-pay | Admitting: Cardiology

## 2018-02-11 NOTE — Telephone Encounter (Signed)
New message    Patient has questions about "button" on monitor. Requesting directions on how to operate.

## 2018-02-11 NOTE — Telephone Encounter (Signed)
Patient had question of whether she should have pushed a button when she removed her holter monitor.  Assured patient it was not necessary to push a button when removing a holter monitor.

## 2018-02-20 ENCOUNTER — Encounter: Payer: Self-pay | Admitting: Cardiology

## 2018-02-23 ENCOUNTER — Encounter: Payer: Self-pay | Admitting: Cardiology

## 2018-02-24 ENCOUNTER — Encounter: Payer: Self-pay | Admitting: Cardiology

## 2018-02-25 ENCOUNTER — Encounter: Payer: Self-pay | Admitting: Cardiology

## 2018-08-18 DIAGNOSIS — M7712 Lateral epicondylitis, left elbow: Secondary | ICD-10-CM | POA: Insufficient documentation

## 2018-11-10 ENCOUNTER — Encounter: Payer: Self-pay | Admitting: Neurology

## 2019-01-11 DIAGNOSIS — H9313 Tinnitus, bilateral: Secondary | ICD-10-CM | POA: Insufficient documentation

## 2019-01-19 ENCOUNTER — Other Ambulatory Visit: Payer: Self-pay | Admitting: Internal Medicine

## 2019-01-19 DIAGNOSIS — Z1231 Encounter for screening mammogram for malignant neoplasm of breast: Secondary | ICD-10-CM

## 2019-01-21 ENCOUNTER — Ambulatory Visit: Payer: 59 | Admitting: Neurology

## 2019-01-27 ENCOUNTER — Encounter

## 2019-01-27 ENCOUNTER — Ambulatory Visit (INDEPENDENT_AMBULATORY_CARE_PROVIDER_SITE_OTHER): Payer: 59 | Admitting: Neurology

## 2019-01-27 ENCOUNTER — Encounter: Payer: Self-pay | Admitting: Neurology

## 2019-01-27 ENCOUNTER — Ambulatory Visit: Payer: 59 | Admitting: Neurology

## 2019-01-27 ENCOUNTER — Other Ambulatory Visit: Payer: Self-pay

## 2019-01-27 VITALS — BP 130/78 | HR 76 | Temp 98.5°F | Ht 67.0 in | Wt 186.0 lb

## 2019-01-27 DIAGNOSIS — G4733 Obstructive sleep apnea (adult) (pediatric): Secondary | ICD-10-CM

## 2019-01-27 DIAGNOSIS — M5416 Radiculopathy, lumbar region: Secondary | ICD-10-CM

## 2019-01-27 DIAGNOSIS — M5412 Radiculopathy, cervical region: Secondary | ICD-10-CM

## 2019-01-27 DIAGNOSIS — G40309 Generalized idiopathic epilepsy and epileptic syndromes, not intractable, without status epilepticus: Secondary | ICD-10-CM

## 2019-01-27 NOTE — Patient Instructions (Signed)
1. Schedule sleep study. Let's see what sleep study shows, and if CPAP is recommended, see how headaches respond 2. Continue all your medications. Let us know when you need refills 3. If balance or weakness worsens, we can refer to Physical therapy 4. Follow-up in 6 months, call for any changes  Seizure Precautions: 1. If medication has been prescribed for you to prevent seizures, take it exactly as directed.  Do not stop taking the medicine without talking to your doctor first, even if you have not had a seizure in a long time.   2. Avoid activities in which a seizure would cause danger to yourself or to others.  Don't operate dangerous machinery, swim alone, or climb in high or dangerous places, such as on ladders, roofs, or girders.  Do not drive unless your doctor says you may.  3. If you have any warning that you may have a seizure, lay down in a safe place where you can't hurt yourself.    4.  No driving for 6 months from last seizure, as per [facility].   Please refer to the following link on the Epilepsy Foundation of America's website for more information: http://www.epilepsyfoundation.org/answerplace/Social/driving/drivingu.cfm   5.  Maintain good sleep hygiene. Avoid alcohol.  6.  Contact your doctor if you have any problems that may be related to the medicine you are taking.  7.  Call 911 and bring the patient back to the ED if:        A.  The seizure lasts longer than 5 minutes.       B.  The patient doesn't awaken shortly after the seizure  C.  The patient has new problems such as difficulty seeing, speaking or moving  D.  The patient was injured during the seizure  E.  The patient has a temperature over 102 F (39C)  F.  The patient vomited and now is having trouble breathing
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
## 2019-02-09 MED ORDER — LAMOTRIGINE 200 MG PO TABS: 200.0000 mg | ORAL_TABLET | Freq: Two times a day (BID) | ORAL | 1 refills | Status: DC

## 2019-02-15 ENCOUNTER — Inpatient Hospital Stay: Admission: RE | Admit: 2019-02-15 | Payer: 59 | Source: Ambulatory Visit

## 2019-03-09 ENCOUNTER — Other Ambulatory Visit: Payer: Self-pay

## 2019-03-09 DIAGNOSIS — G4733 Obstructive sleep apnea (adult) (pediatric): Secondary | ICD-10-CM

## 2019-03-16 ENCOUNTER — Ambulatory Visit: Payer: 59

## 2019-04-05 ENCOUNTER — Ambulatory Visit: Payer: 59

## 2019-04-13 ENCOUNTER — Other Ambulatory Visit: Payer: Self-pay

## 2019-04-13 MED ORDER — BACLOFEN 20 MG PO TABS: 20.0000 mg | ORAL_TABLET | Freq: Three times a day (TID) | ORAL | 1 refills | Status: DC

## 2019-04-13 MED ORDER — GABAPENTIN 300 MG PO CAPS: 300.0000 mg | ORAL_CAPSULE | Freq: Two times a day (BID) | ORAL | 0 refills | Status: DC

## 2019-05-01 ENCOUNTER — Other Ambulatory Visit: Payer: Self-pay

## 2019-05-01 ENCOUNTER — Ambulatory Visit
Admission: RE | Admit: 2019-05-01 | Discharge: 2019-05-01 | Disposition: A | Discharge status: Home / Self Care | Payer: 59 | Source: Ambulatory Visit | Attending: Internal Medicine | Admitting: Internal Medicine

## 2019-05-01 DIAGNOSIS — Z1231 Encounter for screening mammogram for malignant neoplasm of breast: Secondary | ICD-10-CM

## 2019-05-05 ENCOUNTER — Other Ambulatory Visit: Payer: Self-pay

## 2019-05-05 ENCOUNTER — Ambulatory Visit (HOSPITAL_BASED_OUTPATIENT_CLINIC_OR_DEPARTMENT_OTHER): Payer: 59 | Attending: Neurology | Admitting: Internal Medicine

## 2019-05-05 DIAGNOSIS — G4733 Obstructive sleep apnea (adult) (pediatric): Secondary | ICD-10-CM

## 2019-05-08 DIAGNOSIS — G4733 Obstructive sleep apnea (adult) (pediatric): Secondary | ICD-10-CM | POA: Diagnosis not present

## 2019-05-08 NOTE — Procedures (Signed)
     Patient Name: Breigh, Annett Date: 05/06/2019 Gender: Female D.O.B: 01/25/1966 Age (years): 52 Referring Provider: Cameron Sprang Height (inches): 42 Interpreting Physician: Baird Lyons MD, ABSM Weight (lbs): 186 RPSGT: Jacolyn Reedy BMI: 29 MRN: [id number] Neck Size: 14.00  CLINICAL INFORMATION Sleep Study Type: HST Indication for sleep study: OSA Epworth Sleepiness Score: 13  SLEEP STUDY TECHNIQUE A multi-channel overnight portable sleep study was performed. The channels recorded were: nasal airflow, thoracic respiratory movement, and oxygen saturation with a pulse oximetry. Snoring was also monitored.  MEDICATIONS Patient self administered medications include: none reported.  SLEEP ARCHITECTURE Patient was studied for 388.4 minutes. The sleep efficiency was 100.0 % and the patient was supine for 89.8%. The arousal index was 0.0 per hour.  RESPIRATORY PARAMETERS The overall AHI was 43.4 per hour, with a central apnea index of 0.0 per hour. The oxygen nadir was 73% during sleep.  CARDIAC DATA Mean heart rate during sleep was 64.1 bpm.  IMPRESSIONS - Severe obstructive sleep apnea occurred during this study (AHI = 43.4/h). - No significant central sleep apnea occurred during this study (CAI = 0.0/h). - Oxygen desaturation was noted during this study (Min O2 = 73%). Meansat 95%. - Patient snored.  DIAGNOSIS - Obstructive Sleep Apnea (327.23 [G47.33 ICD-10])  RECOMMENDATIONS - Suggest CPAP titration sleep study or autopap. Other options would be based on clinical judgment. - Be careful with alcohol, sedatives and other CNS depressants that may worsen sleep apnea and disrupt normal sleep architecture. - Sleep hygiene should be reviewed to assess factors that may improve sleep quality. - Weight management and regular exercise should be initiated or continued.  [Electronically signed] 05/08/2019 10:34 AM  Baird Lyons MD, ABSM Diplomate, American  Board of Sleep Medicine   NPI: [id number]                        [place], [facility]  ELECTRONICALLY SIGNED ON:  05/08/2019, 10:34 AM [place] PH: [phone]   FX: [phone]

## 2019-05-10 ENCOUNTER — Other Ambulatory Visit: Payer: Self-pay

## 2019-05-10 DIAGNOSIS — G4733 Obstructive sleep apnea (adult) (pediatric): Secondary | ICD-10-CM

## 2019-05-10 NOTE — Progress Notes (Signed)
Pls let patient know that the sleep study shows worsening compared to her prior study, sleep study reported severe sleep apnea and recommends CPAP. Pls let her know we are ordering the CPAP study and will refer her to a sleep specialist to manage the sleep apnea. Pls order CPAP titration, Dx: obstructive sleep apnea and pls send referral to Sleep Medicine. Thanks

## 2019-05-10 NOTE — Progress Notes (Signed)
Pt called no answer voice mail left for her to return my call

## 2019-05-10 NOTE — Progress Notes (Signed)
Pt called given sleep apnea results orders placed in epic

## 2019-05-11 ENCOUNTER — Other Ambulatory Visit: Payer: Self-pay

## 2019-05-11 ENCOUNTER — Telehealth: Payer: Self-pay | Admitting: Neurology

## 2019-05-11 DIAGNOSIS — G4733 Obstructive sleep apnea (adult) (pediatric): Secondary | ICD-10-CM

## 2019-05-11 NOTE — Telephone Encounter (Signed)
Referral changed and placed for [facility]

## 2019-05-11 NOTE — Telephone Encounter (Signed)
Patient wants to be referred to [facility] for a sleep study

## 2019-05-24 ENCOUNTER — Encounter: Payer: Self-pay | Admitting: Pulmonary Disease

## 2019-05-24 ENCOUNTER — Ambulatory Visit (INDEPENDENT_AMBULATORY_CARE_PROVIDER_SITE_OTHER): Payer: 59 | Admitting: Pulmonary Disease

## 2019-05-24 ENCOUNTER — Other Ambulatory Visit: Payer: Self-pay

## 2019-05-24 VITALS — BP 146/72 | HR 80 | Temp 98.4°F | Ht 67.0 in | Wt 187.6 lb

## 2019-05-24 DIAGNOSIS — G4719 Other hypersomnia: Secondary | ICD-10-CM | POA: Diagnosis not present

## 2019-05-24 DIAGNOSIS — G4733 Obstructive sleep apnea (adult) (pediatric): Secondary | ICD-10-CM | POA: Diagnosis not present

## 2019-05-24 NOTE — Patient Instructions (Signed)
We will contact medical supply company to set you up with CPAP   Auto CPAP, settings 5-15  With heated humidification   I will see you back in the office in about 3 months   Call with any significant concerns   We will follow-up on a regular basis with compliance data from the machine

## 2019-05-24 NOTE — Progress Notes (Signed)
Subjective:     Patient ID: [name] [name], female   DOB: 30-Apr-1966, 53 y.o.   MRN: [id number]  Patient being seen for obstructive sleep apnea  Since sleep study showing severe obstructive sleep apnea Had a previous study about 5 years ago with borderline sleep disordered breathing Has gained some weight recently  History of migraine headaches History of loud snoring Usually goes to bed between 1 and 2 AM Takes about 30 minutes or less to fall asleep Weeks up between 1 and 3 times at night  Final awakening time of 0930  No dryness of the mouth in the mornings Memory is intact No family history of obstructive sleep apnea  History of chronic neck pain, back surgery in the past    Review of Systems  Constitutional: Positive for unexpected weight change.  HENT: Positive for rhinorrhea.   Eyes: Negative.   Respiratory: Positive for shortness of breath.   Cardiovascular: Positive for palpitations and leg swelling.  Gastrointestinal: Negative.        Reflux  Endocrine: Negative.   Genitourinary: Negative.   Musculoskeletal: Positive for back pain, joint swelling and neck pain.  Skin: Negative.   Allergic/Immunologic: Positive for environmental allergies and food allergies.  Neurological: Positive for headaches.  Hematological: Negative.   Psychiatric/Behavioral: Negative.    Past Medical History:  Diagnosis Date  . Asthma   . Chronic headaches   . Complex partial seizures ([place])   . Dyspnea 12/24/2006   B/P MV - normal perfusion in all regions; post stress LV normal in size; EF 63%; no significant ischemia noted  . Hypertension   . Kidney disease    per patient stage 2  . Palpitations 12/24/2006   Echo - EF >55%; flow pattern normal for age; aortic valve mildly sclerotic  . Palpitations 01/01/2007   30 day event monitor - 69 pt reported events- some PAC and PVCs reported  . PVC's (premature ventricular contractions)   . Sleep apnea   . SOB (shortness of breath)  04/20/2012   MET test - exercise limiting factor - chronotropic incompetence (medication), peak VO2 76% of predicted; no EKG ST changes noted at sub-optimal peak HR   Social History   Socioeconomic History  . Marital status: Married    Spouse name: Not on file  . Number of children: Not on file  . Years of education: Not on file  . Highest education level: Not on file  Occupational History  . Not on file  Social Needs  . Financial resource strain: Not on file  . Food insecurity    Worry: Not on file    Inability: Not on file  . Transportation needs    Medical: Not on file    Non-medical: Not on file  Tobacco Use  . Smoking status: Never Smoker  . Smokeless tobacco: Never Used  Substance and Sexual Activity  . Alcohol use: No  . Drug use: No  . Sexual activity: Not on file  Lifestyle  . Physical activity    Days per week: Not on file    Minutes per session: Not on file  . Stress: Not on file  Relationships  . Social Herbalist on phone: Not on file    Gets together: Not on file    Attends religious service: Not on file    Active member of club or organization: Not on file    Attends meetings of clubs or organizations: Not on file    Relationship  status: Not on file  . Intimate partner violence    Fear of current or ex partner: Not on file    Emotionally abused: Not on file    Physically abused: Not on file    Forced sexual activity: Not on file  Other Topics Concern  . Not on file  Social History Narrative   Pt is R handed   Lives in 2 story home with her husband, Girard Cooter.   Has 1 biological child / 1 step child   Associated degree in nursing   disabled   Family History  Problem Relation Age of Onset  . Diabetes Mother   . Hypertension Mother   . Breast cancer Mother 62  . Asthma Brother   . Healthy Brother        Objective:   Physical Exam Constitutional:      Appearance: Normal appearance.  HENT:     Head: Normocephalic and atraumatic.   Eyes:     Extraocular Movements: Extraocular movements intact.     Pupils: Pupils are equal, round, and reactive to light.  Neck:     Musculoskeletal: Normal range of motion and neck supple. No neck rigidity or muscular tenderness.  Cardiovascular:     Rate and Rhythm: Normal rate and regular rhythm.     Pulses: Normal pulses.     Heart sounds: Normal heart sounds. No murmur. No friction rub.  Pulmonary:     Effort: Pulmonary effort is normal. No respiratory distress.     Breath sounds: Normal breath sounds. No stridor. No wheezing or rhonchi.  Abdominal:     General: There is no distension.     Palpations: There is no mass.  Musculoskeletal: Normal range of motion.        General: No swelling or tenderness.  Skin:    General: Skin is warm and dry.     Coloration: Skin is not jaundiced or pale.  Neurological:     General: No focal deficit present.     Mental Status: She is alert.    Vitals:   05/24/19 1558  BP: (!) 146/72  Pulse: 80  Temp: 98.4 F (36.9 C)  SpO2: 98%   Results of the Epworth flowsheet 05/24/2019  Sitting and reading 3  Watching TV 3  Sitting, inactive in a public place (e.g. a theatre or a meeting) 1  As a passenger in a car for an hour without a break 3  Lying down to rest in the afternoon when circumstances permit 2  Sitting and talking to someone 0  Sitting quietly after a lunch without alcohol 3  In a car, while stopped for a few minutes in traffic 2  Total score 17     Recent sleep study reviewed by myself  Assessment:     Severe obstructive sleep apnea -Recent sleep study does reveal severe obstructive sleep apnea  Excessive daytime sleepiness  Nonrestorative sleep  Obesity    Plan:     Patient will benefit from treatment of sleep disordered breathing Pathophysiology of sleep disordered breathing discussed with the patient Treatment options discussed with patient  We will start the patient on auto titrating CPAP 5-15  I will see  her back in the office in about 3 months Courage to stay active  Call with any significant concerns

## 2019-05-24 NOTE — Addendum Note (Signed)
Addended by: Joella Prince on: 05/24/2019 04:43 PM   Modules accepted: Orders

## 2019-06-07 NOTE — Telephone Encounter (Signed)
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  Please advise, thank you.

## 2019-06-17 DIAGNOSIS — M7989 Other specified soft tissue disorders: Secondary | ICD-10-CM | POA: Insufficient documentation

## 2019-07-25 ENCOUNTER — Other Ambulatory Visit: Payer: Self-pay | Admitting: Neurology

## 2019-08-02 ENCOUNTER — Telehealth: Payer: Self-pay | Admitting: Neurology

## 2019-08-02 ENCOUNTER — Other Ambulatory Visit: Payer: Self-pay

## 2019-08-02 MED ORDER — BACLOFEN 20 MG PO TABS: 20.0000 mg | ORAL_TABLET | Freq: Three times a day (TID) | ORAL | 3 refills | Status: DC

## 2019-08-02 NOTE — Telephone Encounter (Signed)
Pt states that we have called in the wrong RX twice now she needs [place] to call in the Baclofen for a 90 Day supply not 30 day supply to the [facility] mail service

## 2019-08-03 ENCOUNTER — Other Ambulatory Visit: Payer: Self-pay

## 2019-08-03 MED ORDER — BACLOFEN 20 MG PO TABS: 20.0000 mg | ORAL_TABLET | Freq: Three times a day (TID) | ORAL | 3 refills | Status: DC

## 2019-08-03 NOTE — Telephone Encounter (Signed)
#  270 of Baclofen with 3 refills sent to [facility]. Pt informed via mychart

## 2019-08-04 ENCOUNTER — Other Ambulatory Visit: Payer: Self-pay | Admitting: Neurology

## 2019-08-18 ENCOUNTER — Ambulatory Visit (INDEPENDENT_AMBULATORY_CARE_PROVIDER_SITE_OTHER): Payer: 59 | Admitting: Pulmonary Disease

## 2019-08-18 ENCOUNTER — Other Ambulatory Visit: Payer: Self-pay

## 2019-08-18 ENCOUNTER — Encounter: Payer: Self-pay | Admitting: Pulmonary Disease

## 2019-08-18 VITALS — BP 126/84 | HR 66 | Temp 97.2°F | Ht 67.0 in | Wt 187.4 lb

## 2019-08-18 DIAGNOSIS — Z9989 Dependence on other enabling machines and devices: Secondary | ICD-10-CM | POA: Diagnosis not present

## 2019-08-18 DIAGNOSIS — G4733 Obstructive sleep apnea (adult) (pediatric): Secondary | ICD-10-CM

## 2019-08-18 NOTE — Patient Instructions (Signed)
Obstructive sleep apnea The CPAP compliance shows adequate treatment  Let us know if you are still having any problems following your visit to the medical supply company tomorrow  I will see you back in the office in about 6 months  Call with any significant concerns

## 2019-08-18 NOTE — Progress Notes (Signed)
Subjective:     Patient ID: [name] [name], female   DOB: [birth year], 53 y.o.   MRN: [id number]  Patient being seen for obstructive sleep apnea  Has been using CPAP on a regular basis She is having some issues with her CPAP-we will start off after about 4 hours and sometimes has difficulty having it come back on and stay on She has an appointment with a DME company on 9/31 to troubleshoot the machine  Since sleep study showing severe obstructive sleep apnea Has gained some weight recently  History of migraine headaches History of loud snoring  Takes about 30 minutes or less to fall asleep Weeks up between 1 and 3 times at night  Final awakening time of 0930  No dryness of the mouth in the mornings Memory is intact No family history of obstructive sleep apnea  History of chronic neck pain, back surgery in the past  She gets about    Review of Systems  Constitutional: Positive for unexpected weight change.  HENT: Positive for rhinorrhea.   Eyes: Negative.   Respiratory: Positive for shortness of breath.   Cardiovascular: Negative for palpitations and leg swelling.  Gastrointestinal: Negative.   Endocrine: Negative.   Genitourinary: Negative.   Skin: Negative.   Hematological: Negative.   Psychiatric/Behavioral: Negative.    Past Medical History:  Diagnosis Date  . Asthma   . Chronic headaches   . Complex partial seizures ([place])   . Dyspnea 12/24/2006   B/P MV - normal perfusion in all regions; post stress LV normal in size; EF 63%; no significant ischemia noted  . Hypertension   . Kidney disease    per patient stage 2  . Palpitations 12/24/2006   Echo - EF >55%; flow pattern normal for age; aortic valve mildly sclerotic  . Palpitations 01/01/2007   30 day event monitor - 69 pt reported events- some PAC and PVCs reported  . PVC's (premature ventricular contractions)   . Sleep apnea   . SOB (shortness of breath) 04/20/2012   MET test - exercise limiting factor -  chronotropic incompetence (medication), peak VO2 76% of predicted; no EKG ST changes noted at sub-optimal peak HR   Social History   Socioeconomic History  . Marital status: Married    Spouse name: Not on file  . Number of children: Not on file  . Years of education: Not on file  . Highest education level: Not on file  Occupational History  . Not on file  Social Needs  . Financial resource strain: Not on file  . Food insecurity    Worry: Not on file    Inability: Not on file  . Transportation needs    Medical: Not on file    Non-medical: Not on file  Tobacco Use  . Smoking status: Never Smoker  . Smokeless tobacco: Never Used  Substance and Sexual Activity  . Alcohol use: No  . Drug use: No  . Sexual activity: Not on file  Lifestyle  . Physical activity    Days per week: Not on file    Minutes per session: Not on file  . Stress: Not on file  Relationships  . Social Herbalist on phone: Not on file    Gets together: Not on file    Attends religious service: Not on file    Active member of club or organization: Not on file    Attends meetings of clubs or organizations: Not on file  Relationship status: Not on file  . Intimate partner violence    Fear of current or ex partner: Not on file    Emotionally abused: Not on file    Physically abused: Not on file    Forced sexual activity: Not on file  Other Topics Concern  . Not on file  Social History Narrative   Pt is R handed   Lives in 2 story home with her husband, Girard Cooter.   Has 1 biological child / 1 step child   Associated degree in nursing   disabled   Family History  Problem Relation Age of Onset  . Diabetes Mother   . Hypertension Mother   . Breast cancer Mother 65  . Asthma Brother   . Healthy Brother        Objective:   Physical Exam Constitutional:      Appearance: Normal appearance.  HENT:     Head: Normocephalic and atraumatic.  Eyes:     Extraocular Movements: Extraocular  movements intact.     Pupils: Pupils are equal, round, and reactive to light.  Neck:     Musculoskeletal: Normal range of motion and neck supple. No neck rigidity or muscular tenderness.  Cardiovascular:     Rate and Rhythm: Normal rate and regular rhythm.     Pulses: Normal pulses.     Heart sounds: Normal heart sounds. No murmur. No friction rub.  Pulmonary:     Effort: Pulmonary effort is normal. No respiratory distress.     Breath sounds: Normal breath sounds. No stridor. No wheezing or rhonchi.  Neurological:     Mental Status: She is alert.    There were no vitals filed for this visit. Results of the Epworth flowsheet 05/24/2019  Sitting and reading 3  Watching TV 3  Sitting, inactive in a public place (e.g. a theatre or a meeting) 1  As a passenger in a car for an hour without a break 3  Lying down to rest in the afternoon when circumstances permit 2  Sitting and talking to someone 0  Sitting quietly after a lunch without alcohol 3  In a car, while stopped for a few minutes in traffic 2  Total score 17     Recent sleep study reviewed by myself Compliance data reveals excellent compliance with AHI below 5  Assessment:     Severe obstructive sleep apnea -Recent sleep study does reveal severe obstructive sleep apnea -Compliant with CPAP therapy  Excessive daytime sleepiness -Symptoms are improving  Nonrestorative sleep -Improving symptoms  Obesity    Plan:     Continue CPAP therapy with auto titrating CPAP set at 5-15 She will follow-up with DME company to troubleshoot her machine  I will see her back in the office in about 3 months  Encouraged to stay active  Call with any significant concerns

## 2019-09-10 ENCOUNTER — Ambulatory Visit: Payer: 59 | Admitting: Neurology
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
## 2019-10-26 ENCOUNTER — Ambulatory Visit (INDEPENDENT_AMBULATORY_CARE_PROVIDER_SITE_OTHER): Payer: 59 | Admitting: Physician Assistant

## 2019-10-26 ENCOUNTER — Other Ambulatory Visit: Payer: Self-pay

## 2019-10-26 ENCOUNTER — Encounter: Payer: Self-pay | Admitting: Physician Assistant

## 2019-10-26 VITALS — BP 154/86 | HR 65 | Ht 67.0 in | Wt 188.0 lb

## 2019-10-26 DIAGNOSIS — R002 Palpitations: Secondary | ICD-10-CM

## 2019-10-26 DIAGNOSIS — R600 Localized edema: Secondary | ICD-10-CM

## 2019-10-26 DIAGNOSIS — I10 Essential (primary) hypertension: Secondary | ICD-10-CM

## 2019-10-26 DIAGNOSIS — G4733 Obstructive sleep apnea (adult) (pediatric): Secondary | ICD-10-CM

## 2019-10-26 DIAGNOSIS — Z87898 Personal history of other specified conditions: Secondary | ICD-10-CM

## 2019-10-26 DIAGNOSIS — R413 Other amnesia: Secondary | ICD-10-CM

## 2019-10-26 MED ORDER — HYDRALAZINE HCL 25 MG PO TABS: 25.0000 mg | ORAL_TABLET | Freq: Two times a day (BID) | ORAL | 3 refills | Status: DC

## 2019-10-26 NOTE — Patient Instructions (Signed)
Medication Instructions:  Your physician has recommended you make the following change in your medication:   START: hydralazine 25 mg tablet: Take 1 tablet by mouth twice a day  *If you need a refill on your cardiac medications before your next appointment, please call your pharmacy*  Lab Work: None ordered  If you have labs (blood work) drawn today and your tests are completely normal, you will receive your results only by: Marland Kitchen MyChart Message (if you have MyChart) OR . A paper copy in the mail If you have any lab test that is abnormal or we need to change your treatment, we will call you to review the results.  Testing/Procedures: Your physician has requested that you have an echocardiogram. Echocardiography is a painless test that uses sound waves to create images of your heart. It provides your doctor with information about the size and shape of your heart and how well your heart's chambers and valves are working. This procedure takes approximately one hour. There are no restrictions for this procedure.  Follow-Up: At [facility], you and your health needs are our priority.  As part of our continuing mission to provide you with exceptional heart care, we have created designated Provider Care Teams.  These Care Teams include your primary Cardiologist (physician) and Advanced Practice Providers (APPs -  Physician Assistants and Nurse Practitioners) who all work together to provide you with the care you need, when you need it.  Your next appointment:   1 month(s)  The format for your next appointment:   Either In Person or Virtual  Provider:   Ermalinda Barrios, PA-C  Other Instructions

## 2019-10-27 NOTE — Addendum Note (Signed)
Addended by: Gar Ponto on: 10/27/2019 12:11 PM   Modules accepted: Orders

## 2019-10-28 ENCOUNTER — Other Ambulatory Visit: Payer: Self-pay

## 2019-10-28 DIAGNOSIS — G40309 Generalized idiopathic epilepsy and epileptic syndromes, not intractable, without status epilepticus: Secondary | ICD-10-CM

## 2019-11-02 ENCOUNTER — Other Ambulatory Visit: Payer: 59

## 2019-11-02 ENCOUNTER — Other Ambulatory Visit: Payer: Self-pay

## 2019-11-02 DIAGNOSIS — G40309 Generalized idiopathic epilepsy and epileptic syndromes, not intractable, without status epilepticus: Secondary | ICD-10-CM

## 2019-11-04 ENCOUNTER — Ambulatory Visit (HOSPITAL_COMMUNITY): Payer: 59 | Attending: Cardiology

## 2019-11-04 ENCOUNTER — Other Ambulatory Visit: Payer: Self-pay

## 2019-11-04 DIAGNOSIS — R600 Localized edema: Secondary | ICD-10-CM

## 2019-11-06 LAB — LAMOTRIGINE LEVEL: Lamotrigine Lvl: 5.3 ug/mL (ref 4.0–18.0)

## 2019-11-08 ENCOUNTER — Telehealth: Payer: Self-pay | Admitting: Neurology

## 2019-11-08 NOTE — Telephone Encounter (Signed)
Patient called needing to check the status of her forms she sent over. Please Call. Thank you

## 2019-11-08 NOTE — Telephone Encounter (Signed)
We finally received the paperwork. However, Dr. Amparo Bristol signature is required. Mychart message sent to pt asking if another provider would be able to sign. If pt is agreeable then will see if one of the physicians will sign on Dr. Amparo Bristol behalf.

## 2019-11-09 NOTE — Telephone Encounter (Signed)
Pt will wait for Dr. Delice Lesch to return.

## 2019-11-15 ENCOUNTER — Telehealth (INDEPENDENT_AMBULATORY_CARE_PROVIDER_SITE_OTHER): Payer: 59 | Admitting: Neurology

## 2019-11-15 ENCOUNTER — Encounter: Payer: Self-pay | Admitting: Neurology

## 2019-11-15 ENCOUNTER — Other Ambulatory Visit: Payer: Self-pay

## 2019-11-15 VITALS — Ht 67.0 in | Wt 190.0 lb

## 2019-11-15 DIAGNOSIS — M5412 Radiculopathy, cervical region: Secondary | ICD-10-CM

## 2019-11-15 DIAGNOSIS — G40309 Generalized idiopathic epilepsy and epileptic syndromes, not intractable, without status epilepticus: Secondary | ICD-10-CM | POA: Diagnosis not present

## 2019-11-15 DIAGNOSIS — G43009 Migraine without aura, not intractable, without status migrainosus: Secondary | ICD-10-CM

## 2019-11-15 DIAGNOSIS — G4733 Obstructive sleep apnea (adult) (pediatric): Secondary | ICD-10-CM
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
## 2019-11-16 ENCOUNTER — Other Ambulatory Visit: Payer: Self-pay

## 2019-11-16 DIAGNOSIS — G40309 Generalized idiopathic epilepsy and epileptic syndromes, not intractable, without status epilepticus: Secondary | ICD-10-CM

## 2019-11-17 ENCOUNTER — Other Ambulatory Visit: Payer: Self-pay

## 2019-11-17 DIAGNOSIS — M79672 Pain in left foot: Secondary | ICD-10-CM

## 2019-11-17 DIAGNOSIS — M79671 Pain in right foot: Secondary | ICD-10-CM

## 2019-11-17 NOTE — Progress Notes (Signed)
amb  

## 2019-11-18 ENCOUNTER — Other Ambulatory Visit: Payer: Self-pay

## 2019-11-18 DIAGNOSIS — G40309 Generalized idiopathic epilepsy and epileptic syndromes, not intractable, without status epilepticus: Secondary | ICD-10-CM

## 2019-11-23 ENCOUNTER — Telehealth: Payer: Self-pay | Admitting: Neurology

## 2019-11-23 NOTE — Telephone Encounter (Signed)
Patient just dropped forms off about 20 mins ago and wanted you to know

## 2019-11-24 NOTE — Telephone Encounter (Signed)
Noted. Will have Dr. Karel Jarvis complete in 7-10 days and then notify pt.

## 2019-11-25 ENCOUNTER — Ambulatory Visit: Payer: 59 | Admitting: Podiatry

## 2019-11-26 ENCOUNTER — Telehealth: Payer: Self-pay | Admitting: Neurology

## 2019-11-26 NOTE — Telephone Encounter (Signed)
Patient called in regarding some paperwork that she left for Dr. Karel Jarvis to fill out. She said that she forgot to sign her portion. Please Call. Thank you

## 2019-11-26 NOTE — Telephone Encounter (Signed)
spoke with pt informed her we would call her once paper work was ready

## 2019-11-29 ENCOUNTER — Telehealth: Payer: Self-pay | Admitting: Neurology

## 2019-11-29 NOTE — Telephone Encounter (Signed)
Patient is scheduled to have an EEG on 12/08/19 at 2:30 pm. Thank you! Sorry, I'm unable to see the orders.

## 2019-11-29 NOTE — Telephone Encounter (Signed)
Pt does need a one hour EEG. There is an order in Epic. Let me know if there is anything else I need to do. Thank you.

## 2019-11-29 NOTE — Telephone Encounter (Signed)
Patient called regarding an EEG? She said that no one has contacted her to schedule one. Should she be scheduled for an EEG and if so what type of EEG would she need? Thank you

## 2019-12-08 ENCOUNTER — Other Ambulatory Visit: Payer: 59

## 2019-12-08 ENCOUNTER — Ambulatory Visit: Payer: 59 | Admitting: Physician Assistant

## 2019-12-09 ENCOUNTER — Ambulatory Visit (INDEPENDENT_AMBULATORY_CARE_PROVIDER_SITE_OTHER): Payer: 59 | Admitting: Podiatry

## 2019-12-09 ENCOUNTER — Other Ambulatory Visit: Payer: Self-pay

## 2019-12-09 ENCOUNTER — Ambulatory Visit (INDEPENDENT_AMBULATORY_CARE_PROVIDER_SITE_OTHER): Payer: 59

## 2019-12-09 ENCOUNTER — Other Ambulatory Visit: Payer: Self-pay | Admitting: Podiatry

## 2019-12-09 ENCOUNTER — Encounter: Payer: Self-pay | Admitting: Podiatry

## 2019-12-09 VITALS — Temp 97.3°F

## 2019-12-09 DIAGNOSIS — M2042 Other hammer toe(s) (acquired), left foot: Secondary | ICD-10-CM | POA: Diagnosis not present

## 2019-12-09 DIAGNOSIS — M722 Plantar fascial fibromatosis: Secondary | ICD-10-CM

## 2019-12-09 DIAGNOSIS — M79671 Pain in right foot: Secondary | ICD-10-CM

## 2019-12-09 DIAGNOSIS — M2041 Other hammer toe(s) (acquired), right foot: Secondary | ICD-10-CM

## 2019-12-09 DIAGNOSIS — G629 Polyneuropathy, unspecified: Secondary | ICD-10-CM | POA: Diagnosis not present

## 2019-12-09 DIAGNOSIS — M79672 Pain in left foot: Secondary | ICD-10-CM | POA: Diagnosis not present

## 2019-12-09 NOTE — Patient Instructions (Signed)

## 2019-12-09 NOTE — Progress Notes (Signed)
   Subjective:    Patient ID: [name] [name], female    DOB: 06/26/1966, 54 y.o.   MRN: [id number]  HPI    Review of Systems  Respiratory: Positive for shortness of breath.   All other systems reviewed and are negative.      Objective:   Physical Exam        Assessment & Plan:

## 2019-12-10 ENCOUNTER — Other Ambulatory Visit: Payer: Self-pay | Admitting: Internal Medicine

## 2019-12-10 DIAGNOSIS — Z1231 Encounter for screening mammogram for malignant neoplasm of breast: Secondary | ICD-10-CM

## 2019-12-13 ENCOUNTER — Ambulatory Visit (INDEPENDENT_AMBULATORY_CARE_PROVIDER_SITE_OTHER): Payer: 59 | Admitting: Neurology

## 2019-12-13 ENCOUNTER — Other Ambulatory Visit: Payer: Self-pay

## 2019-12-13 DIAGNOSIS — G40309 Generalized idiopathic epilepsy and epileptic syndromes, not intractable, without status epilepticus: Secondary | ICD-10-CM

## 2019-12-13 NOTE — Progress Notes (Signed)
Subjective:   Patient ID: [name] [name], female   DOB: 54 y.o.   MRN: [id number]   HPI Patient presents stating that she has had a lot of pain in both her feet for a number of months and that the forefoot hurts and also the heels are bothering her quite a bit.  States this is been ongoing and gradually become more difficult for her and she does like to try to be active if possible.  Patient does not currently smoke and is not active    Review of Systems  All other systems reviewed and are negative.       Objective:  Physical Exam Vitals and nursing note reviewed.  Constitutional:      Appearance: She is well-developed.  Pulmonary:     Effort: Pulmonary effort is normal.  Musculoskeletal:        General: Normal range of motion.  Skin:    General: Skin is warm.  Neurological:     Mental Status: She is alert.     Neurovascular status was found to be intact muscle strength was mildly diminished range of motion diminished subtalar midtarsal joint.  Patient is found to have quite a bit of discomfort in the plantar fascial bilateral at the insertion tendon calcaneus with moderate depression of the arch and forefoot discomfort bilateral with inflammation around the lesser MPJs.  Patient is found to have good digital perfusion well oriented and has structural swelling of the fifth digit right where she did have trauma.  Good digital perfusion noted and she is well oriented     Assessment:  Difficult to make determination as where pain may be originating from with possibility for acute plantar fasciitis creating change in gait creating compensatory-like symptoms along with possibility for systemic inflammatory condition agent     Plan:  P reviewed all conditions discussed and at this point I went ahead and I did do sterile prep subjective the plantar fascial bilateral 3 mg Kenalog 5 mg Xylocaine applied fascial brace bilateral gave instructions for support exercises physical therapy and  patient will be seen back and may need to work on different areas depending on response over the next several weeks  X-rays indicate that there is moderate depression of the arch spur formation no indications of advanced arthritis currently

## 2019-12-21 NOTE — Procedures (Signed)
ELECTROENCEPHALOGRAM REPORT  Date of Study: 12/13/2019  Patient's Name: [name] [name] MRN: [id number] Date of Birth: [birth year]  Referring Provider: Dr. Patrcia Dolly  Clinical History: This is a 54 year old woman seizure-free since 2015, who had an amnestic/confusional episode last 10/26/2019.   Medications: LAMICTAL 200 MG tablet TYLENOL 500 MG tablet aspirin 81 MG tablet LIORESAL 20 MG tablet CARDIZEM 120 MG tablet LASIX 20 MG tablet NEURONTIN 300 MG capsule APRESOLINE 25 MG tablet MULTIVITAMIN tablet PRILOSEC OTC 20 MG tablet  Technical Summary: A multichannel digital  1-hour EEG recording measured by the international 10-20 system with electrodes applied with paste and impedances below 5000 ohms performed in our laboratory with EKG monitoring in an awake and asleep patient.  Hyperventilation was not performed. Photic stimulation was performed.  The digital EEG was referentially recorded, reformatted, and digitally filtered in a variety of bipolar and referential montages for optimal display.    Description: The patient is awake and asleep during the recording.  During maximal wakefulness, there is a symmetric, medium voltage 10.5 Hz posterior dominant rhythm that attenuates with eye opening.  The record is symmetric.  During drowsiness and sleep, there is an increase in theta slowing of the background.  Vertex waves and symmetric sleep spindles were seen.  Photic stimulation did not elicit any abnormalities.  There were no epileptiform discharges or electrographic seizures seen.    EKG lead was unremarkable.  Impression: This 1-hour awake and asleep EEG is normal.    Clinical Correlation: A normal EEG does not exclude a clinical diagnosis of epilepsy.  If further clinical questions remain, prolonged EEG may be helpful.  Clinical correlation is advised.   Patrcia Dolly, M.D.

## 2019-12-22 ENCOUNTER — Telehealth: Payer: Self-pay

## 2019-12-22 NOTE — Telephone Encounter (Signed)
Pt called and informed that the EEG was normal, however it is not like a pregnancy test that is positive or negative, it is just a snapshot of her brain waves. Stay on same medications, if the same confusion episodes occur, we will plan to do a longer EEG. Pt verbalized understanding

## 2019-12-23 ENCOUNTER — Encounter: Payer: Self-pay | Admitting: Physician Assistant

## 2019-12-24 ENCOUNTER — Ambulatory Visit (INDEPENDENT_AMBULATORY_CARE_PROVIDER_SITE_OTHER): Payer: 59 | Admitting: Family Medicine

## 2019-12-24 ENCOUNTER — Encounter: Payer: Self-pay | Admitting: Physician Assistant

## 2019-12-24 ENCOUNTER — Other Ambulatory Visit: Payer: Self-pay

## 2019-12-24 VITALS — BP 140/72 | HR 75 | Ht 67.0 in | Wt 190.8 lb

## 2019-12-24 DIAGNOSIS — I10 Essential (primary) hypertension: Secondary | ICD-10-CM | POA: Diagnosis not present

## 2019-12-24 DIAGNOSIS — E782 Mixed hyperlipidemia: Secondary | ICD-10-CM | POA: Diagnosis not present

## 2019-12-24 DIAGNOSIS — R002 Palpitations: Secondary | ICD-10-CM | POA: Diagnosis not present

## 2019-12-24 DIAGNOSIS — G4733 Obstructive sleep apnea (adult) (pediatric): Secondary | ICD-10-CM | POA: Diagnosis not present

## 2019-12-24 NOTE — Patient Instructions (Signed)
Medication Instructions:  Your physician recommends that you continue on your current medications as directed. Please refer to the Current Medication list given to you today.  *If you need a refill on your cardiac medications before your next appointment, please call your pharmacy*  Lab Work: None ordered  If you have labs (blood work) drawn today and your tests are completely normal, you will receive your results only by: Marland Kitchen MyChart Message (if you have MyChart) OR . A paper copy in the mail If you have any lab test that is abnormal or we need to change your treatment, we will call you to review the results.  Testing/Procedures: None ordered  Follow-Up: At [facility], you and your health needs are our priority.  As part of our continuing mission to provide you with exceptional heart care, we have created designated Provider Care Teams.  These Care Teams include your primary Cardiologist (physician) and Advanced Practice Providers (APPs -  Physician Assistants and Nurse Practitioners) who all work together to provide you with the care you need, when you need it.  Your next appointment:   12 month(s)  The format for your next appointment:   In Person  Provider:   You may see Tobias Alexander, MD or one of the following Advanced Practice Providers on your designated Care Team:    Ronie Spies, PA-C  Jacolyn Reedy, PA-C

## 2019-12-24 NOTE — Progress Notes (Addendum)
Cardiology Office Note  Date: 12/24/2019   ID: BROCK MOKRY, DOB 1966-11-09, MRN [id number]  PCP:  Karleen Hampshire., MD  Cardiologist:  Ena Dawley, MD Electrophysiologist:  None   Chief Complaint  Patient presents with  . 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 History of chest pain and shortness of breath with prior negative stress test in 2008 and 2016.  Echocardiogram 1220 showed normal left ventricular ejection fraction, 65 to 70%.  Grade 1 diastolic dysfunction, normal RV, mild MR/TR, mildly elevated pulmonary artery systolic pressure.Marland Kitchen  History of palpitation with several monitors over the years.  Study in 2008 showed normal sinus rhythm with occasional PVCs.  Monitor in 2016 showed 1 PVC and 4 PACs, otherwise normal, repeat monitor in 2019 normal without ectopy or arrhythmias.  History of lower extremity edema and started on Lasix by nephrology.  Saw Erasmo Score, PA-C in follow-up in the summer and blood pressure was elevated.  Hydralazine was added.  Last available labs October 2020 showed potassium 4.2 creatinine 1.18, hemoglobin 12.7, 07/2019 LDL 136, triglycerides 53, normal LFTs.  Blood pressure is better today than previous.  140/72.  Patient states she does not want to adjust the hydralazine dosage.  She denies any anginal or exertional symptoms, palpitations or arrhythmias, orthostatic symptoms, stroke or TIA-like symptoms, dyspeptic symptoms,.  Denies any claudication-like symptoms, DVT or PE-like symptoms, has some mild lower extremity edema.  Discussed lipid management with patient.  LDL was 136 in October 2020..  Advised patient she did have some aortic atherosclerosis and LDL goal is less than 70. Past Medical History:   Diagnosis Date  . Aortic atherosclerosis ([place])   . Asthma   . Chronic headaches   . CKD (chronic kidney disease), stage II   . Complex partial seizures ([place])   . Dyspnea 12/24/2006   B/P MV - normal perfusion in all regions; post stress LV normal in size; EF 63%; no significant ischemia noted  . Hyperlipidemia   . Hypertension   . Palpitations 01/01/2007   30 day event monitor - 69 pt reported events- some PAC and PVCs reported  . PVC's (premature ventricular contractions)   . Sleep apnea   . SOB (shortness of breath) 04/20/2012   MET test - exercise limiting factor - chronotropic incompetence (medication), peak VO2 76% of predicted; no EKG ST changes noted at sub-optimal peak HR    Past Surgical History:  Procedure Laterality Date  . ABDOMINAL HYSTERECTOMY  02/2003  . ACD  04/24/2010   Done at [facility] --C2-3 and C6 and C7  . ACDF N/A 05/01/2007   Done at [facility] --C5 to C6  . BILATERAL OOPHORECTOMY  05/2004  . CERVICAL DISC SURGERY  12/01/2002   C4-C5 fusion and bone graft    Current Outpatient Medications  Medication Sig Dispense Refill  . acetaminophen (TYLENOL) 500 MG tablet Take 1,000 mg by mouth every 6 (six) hours as needed for pain (pain).    Marland Kitchen aspirin 81 MG tablet Take 81 mg by mouth daily.    . baclofen (LIORESAL) 20 MG tablet Take 1 tablet (20 mg total) by mouth 3 (three) times daily. 270 each 3  . betamethasone dipropionate 0.05 % cream     . diltiazem (CARDIZEM) 120  MG tablet Take 240 mg by mouth 2 (two) times daily.    . furosemide (LASIX) 20 MG tablet     . gabapentin (NEURONTIN) 300 MG capsule TAKE 1 CAPSULE BY MOUTH IN  THE MORNING AND 2 TO 3  CAPSULES IN THE EVENING 360 capsule 3  . hydrALAZINE (APRESOLINE) 25 MG tablet Take 1 tablet (25 mg total) by mouth 2 (two) times daily. 180 tablet 3  . lamoTRIgine (LAMICTAL) 200 MG tablet TAKE 1 TABLET BY MOUTH  TWICE DAILY 180 tablet 3  . Multiple Vitamin (MULTIVITAMIN) tablet Take 1 tablet by mouth daily.    . NON  FORMULARY Take 1,000 mg by mouth daily. [facility] Vitamin D    . omeprazole (PRILOSEC OTC) 20 MG tablet Take 20 mg by mouth daily.     No current facility-administered medications for this visit.   Allergies:  Camphor-menthol, Fish allergy, Fish-derived products, [place]arna sensitive, Wheat bran, Adhesive [tape], Sulfa antibiotics, Sulfacetamide sodium, Sulfasalazine, Halog [halcinonide], Other, Ketoconazole, and Triamcinolone   Social History: The patient  reports that she has never smoked. She has never used smokeless tobacco. She reports that she does not drink alcohol or use drugs.   Family History: The patient's family history includes Asthma in her brother; Breast cancer (age of onset: 46) in her mother; Diabetes in her mother; Healthy in her brother; Hypertension in her mother.   ROS:  Please see the history of present illness. Otherwise, complete review of systems is positive for none.  All other systems are reviewed and negative.   Physical Exam: VS:  BP 140/72   Pulse 75   Ht '5\' 7"'  (1.702 m)   Wt 190 lb 12.8 oz (86.5 kg)   SpO2 98%   BMI 29.88 kg/m , BMI Body mass index is 29.88 kg/m.  Wt Readings from Last 3 Encounters:  12/24/19 190 lb 12.8 oz (86.5 kg)  11/15/19 190 lb (86.2 kg)  10/26/19 188 lb (85.3 kg)    General: Patient appears comfortable at rest. Neck: Supple, no elevated JVP or carotid bruits, no thyromegaly. Lungs: Clear to auscultation, nonlabored breathing at rest. Cardiac: Regular rate and rhythm, no S3 or significant systolic murmur, no pericardial rub. Extremities: No pitting edema, distal pulses 2+. Skin: Warm and dry. Musculoskeletal: No kyphosis. Neuropsychiatric: Alert and oriented x3, affect grossly appropriate.  ECG:  An ECG dated October 27, 2019 was personally reviewed today and demonstrated:  Normal sinus rhythm rate of 65.  No acute ST or T wave abnormalities noted.  Recent Labwork: No results found for requested labs within last 8760 hours.  No  results found for: CHOL, TRIG, HDL, CHOLHDL, VLDL, LDLCALC, LDLDIRECT  Other Studies Reviewed Today:  Echocardiogram 11/04/2019.  1. Left ventricular ejection fraction, by visual estimation, is 65 to 70%. The left ventricle has normal function. There is borderline left ventricular hypertrophy. 2. Left ventricular diastolic parameters are consistent with Grade I diastolic dysfunction (impaired relaxation). 3. The left ventricle has no regional wall motion abnormalities. 4. Global right ventricle has normal systolic function.The right ventricular size is normal. No increase in right ventricular wall thickness. 5. Left atrial size was normal. 6. Right atrial size was normal. 7. The mitral valve is normal in structure. Mild mitral valve regurgitation. No evidence of mitral stenosis. 8. The tricuspid valve is normal in structure. Tricuspid valve regurgitation is mild. 9. The aortic valve is normal in structure. Aortic valve regurgitation is not visualized. No evidence of aortic valve sclerosis or stenosis. 10. The pulmonic  valve was normal in structure. Pulmonic valve regurgitation is not visualized. 11. Mildly elevated pulmonary artery systolic pressure. 12. The tricuspid regurgitant velocity is 2.61 m/s, and with an assumed right atrial pressure of 3 mmHg, the estimated right ventricular systolic pressure is mildly elevated at 30.2 mmHg. 13. The inferior vena cava is normal in size with greater than 50% respiratory variability, suggesting right atrial pressure of 3 mmHg. Assessment and Plan:  1. Essential hypertension   2. OSA (obstructive sleep apnea)   3. Mixed hyperlipidemia   4. Palpitations    1. Essential hypertension Blood pressure has improved since starting hydralazine 25 mg p.o. twice daily.  Blood pressure today 140/72.  Patient wants to defer increasing hydralazine dose.  Continue hydralazine 25 mg p.o. twice daily.  Continue Lasix 20 mg daily Discussed results of  echocardiogram with patient.  Patient verbalizes understanding.  2. OSA (obstructive sleep apnea) Patient states recent sleep study indicated significant obstructive sleep apnea.  She was started on CPAP in August.  She is compliant with CPAP.  3. Mixed hyperlipidemia Patient had an elevated LDL in October 2020 of 136.  Discussed the fact that she has aortic atherosclerosis and the goal for LDL below 70.  Patient prefers not to start any statin medications at this time.  Advised of the risk increased plaque accumulation and atherosclerosis and patient verbalizes understanding  4. Palpitations Patient denies any recent palpitations.  She has a history of PACs and PVCs.  She currently takes Cardizem 120 mg p.o. twice daily.  Continue Cardizem.  Medication Adjustments/Labs and Tests Ordered: Current medicines are reviewed at length with the patient today.  Concerns regarding medicines are outlined above.    Patient Instructions  Medication Instructions:  Your physician recommends that you continue on your current medications as directed. Please refer to the Current Medication list given to you today.  *If you need a refill on your cardiac medications before your next appointment, please call your pharmacy*  Lab Work: None ordered  If you have labs (blood work) drawn today and your tests are completely normal, you will receive your results only by: Marland Kitchen MyChart Message (if you have MyChart) OR . A paper copy in the mail If you have any lab test that is abnormal or we need to change your treatment, we will call you to review the results.  Testing/Procedures: None ordered  Follow-Up: At [facility], you and your health needs are our priority.  As part of our continuing mission to provide you with exceptional heart care, we have created designated Provider Care Teams.  These Care Teams include your primary Cardiologist (physician) and Advanced Practice Providers (APPs -  Physician  Assistants and Nurse Practitioners) who all work together to provide you with the care you need, when you need it.  Your next appointment:   12 month(s)  The format for your next appointment:   In Person  Provider:   You may see Ena Dawley, MD or one of the following Advanced Practice Providers on your designated Care Team:    Melina Copa, PA-C  Ermalinda Barrios, PA-C             Signed, Levell July, NP 12/24/2019 4:58 PM    [place]  [address] [place] [address]

## 2019-12-30 ENCOUNTER — Ambulatory Visit (INDEPENDENT_AMBULATORY_CARE_PROVIDER_SITE_OTHER): Payer: 59 | Admitting: Podiatry

## 2019-12-30 ENCOUNTER — Other Ambulatory Visit: Payer: Self-pay

## 2019-12-30 ENCOUNTER — Ambulatory Visit: Payer: 59 | Admitting: Podiatry

## 2019-12-30 ENCOUNTER — Encounter: Payer: Self-pay | Admitting: Podiatry

## 2019-12-30 DIAGNOSIS — M722 Plantar fascial fibromatosis: Secondary | ICD-10-CM | POA: Diagnosis not present

## 2019-12-31 NOTE — Progress Notes (Signed)
Subjective:   Patient ID: [name] [name], female   DOB: 54 y.o.   MRN: [id number]   HPI Patient states the heels have improved but the right one has been bothersome still and improved but still painful   ROS      Objective:  Physical Exam  Neurovascular status intact with inflammation pain of the right plantar fascia at the insertional point tendon into the calcaneus with fluid buildup around the medial band with improvement but pain still noted     Assessment:  Acute plantar fasciitis right still present     Plan:  H&P condition reviewed sterile prep done and injected the fascia 3 mg Kenalog 5 mg Xylocaine and instructed on physical therapy.  Patient is discharged will be seen back as needed

## 2020-01-04 ENCOUNTER — Encounter: Payer: Self-pay | Admitting: Podiatry

## 2020-01-04 ENCOUNTER — Ambulatory Visit: Payer: 59 | Admitting: Obstetrics and Gynecology

## 2020-01-12 ENCOUNTER — Other Ambulatory Visit (HOSPITAL_COMMUNITY): Payer: Self-pay | Admitting: Gastroenterology

## 2020-01-12 ENCOUNTER — Other Ambulatory Visit: Payer: Self-pay | Admitting: Gastroenterology

## 2020-01-12 DIAGNOSIS — R1011 Right upper quadrant pain: Secondary | ICD-10-CM

## 2020-01-27 ENCOUNTER — Ambulatory Visit: Payer: 59 | Admitting: Podiatry

## 2020-01-28 ENCOUNTER — Encounter (HOSPITAL_COMMUNITY): Payer: Self-pay

## 2020-01-28 ENCOUNTER — Other Ambulatory Visit (HOSPITAL_COMMUNITY): Payer: 59

## 2020-01-28 ENCOUNTER — Encounter (HOSPITAL_COMMUNITY): Payer: 59

## 2020-02-14 ENCOUNTER — Ambulatory Visit (HOSPITAL_COMMUNITY): Payer: 59

## 2020-02-14 ENCOUNTER — Ambulatory Visit (HOSPITAL_COMMUNITY)
Admission: RE | Admit: 2020-02-14 | Discharge: 2020-02-14 | Disposition: A | Discharge status: Home / Self Care | Payer: 59 | Source: Ambulatory Visit | Attending: Gastroenterology | Admitting: Gastroenterology

## 2020-02-14 ENCOUNTER — Other Ambulatory Visit: Payer: Self-pay

## 2020-02-14 DIAGNOSIS — R1011 Right upper quadrant pain: Secondary | ICD-10-CM

## 2020-02-14 MED ORDER — TECHNETIUM TC 99M MEBROFENIN IV KIT
5.3000 | PACK | Freq: Once | INTRAVENOUS | Status: AC | PRN
  Administered 2020-02-14: 5.3 via INTRAVENOUS

## 2020-03-09 ENCOUNTER — Encounter: Payer: Self-pay | Admitting: Pulmonary Disease

## 2020-03-09 ENCOUNTER — Other Ambulatory Visit: Payer: Self-pay

## 2020-03-09 ENCOUNTER — Ambulatory Visit (INDEPENDENT_AMBULATORY_CARE_PROVIDER_SITE_OTHER): Payer: 59 | Admitting: Pulmonary Disease

## 2020-03-09 VITALS — BP 116/66 | HR 74 | Temp 97.2°F | Ht 67.0 in | Wt 193.8 lb

## 2020-03-09 DIAGNOSIS — Z9989 Dependence on other enabling machines and devices: Secondary | ICD-10-CM | POA: Diagnosis not present

## 2020-03-09 DIAGNOSIS — G4733 Obstructive sleep apnea (adult) (pediatric): Secondary | ICD-10-CM

## 2020-03-09 NOTE — Progress Notes (Signed)
Subjective:     Patient ID: [name] [name], female   DOB: [birth year], 54 y.o.   MRN: [id number]  Patient being seen for obstructive sleep apnea  Has been using CPAP on a regular basis No significant issues with her CPAP recently  Since sleep study showing severe obstructive sleep apnea Has gained some weight recently  History of migraine headaches History of loud snoring  Appears to be sleeping better, functioning better with CPAP use  Takes about 30 minutes or less to fall asleep Weeks up between 1 and 3 times at night  Final awakening time of 0930  No dryness of the mouth in the mornings Memory is intact No family history of obstructive sleep apnea  History of chronic neck pain, back surgery in the past      Review of Systems  Constitutional: Positive for unexpected weight change.  HENT: Positive for rhinorrhea.   Eyes: Negative.   Respiratory: Positive for apnea and shortness of breath.   Cardiovascular: Negative for palpitations and leg swelling.  Gastrointestinal: Negative.   Endocrine: Negative.   Genitourinary: Negative.   Psychiatric/Behavioral: Positive for sleep disturbance.   Past Medical History:  Diagnosis Date  . Aortic atherosclerosis ([place])   . Asthma   . Chronic headaches   . CKD (chronic kidney disease), stage II   . Complex partial seizures ([place])   . Dyspnea 12/24/2006   B/P MV - normal perfusion in all regions; post stress LV normal in size; EF 63%; no significant ischemia noted  . Hyperlipidemia   . Hypertension   . Palpitations 01/01/2007   30 day event monitor - 69 pt reported events- some PAC and PVCs reported  . PVC's (premature ventricular contractions)   . Sleep apnea   . SOB (shortness of breath) 04/20/2012   MET test - exercise limiting factor - chronotropic incompetence (medication), peak VO2 76% of predicted; no EKG ST changes noted at sub-optimal peak HR   Social History   Socioeconomic History  . Marital status: Married     Spouse name: Not on file  . Number of children: Not on file  . Years of education: Not on file  . Highest education level: Not on file  Occupational History  . Not on file  Tobacco Use  . Smoking status: Never Smoker  . Smokeless tobacco: Never Used  Substance and Sexual Activity  . Alcohol use: No  . Drug use: No  . Sexual activity: Not on file  Other Topics Concern  . Not on file  Social History Narrative   Pt is R handed   Lives in 2 story home with her husband, Girard Cooter.   Has 1 biological child / 1 step child   Associated degree in nursing   disabled   Social Determinants of Health   Financial Resource Strain:   . Difficulty of Paying Living Expenses:   Food Insecurity:   . Worried About Charity fundraiser in the Last Year:   . Arboriculturist in the Last Year:   Transportation Needs:   . Film/video editor (Medical):   Marland Kitchen Lack of Transportation (Non-Medical):   Physical Activity:   . Days of Exercise per Week:   . Minutes of Exercise per Session:   Stress:   . Feeling of Stress :   Social Connections:   . Frequency of Communication with Friends and Family:   . Frequency of Social Gatherings with Friends and Family:   . Attends Religious Services:   .  Active Member of Clubs or Organizations:   . Attends Archivist Meetings:   Marland Kitchen Marital Status:   Intimate Partner Violence:   . Fear of Current or Ex-Partner:   . Emotionally Abused:   Marland Kitchen Physically Abused:   . Sexually Abused:    Family History  Problem Relation Age of Onset  . Diabetes Mother   . Hypertension Mother   . Breast cancer Mother 72  . Asthma Brother   . Healthy Brother        Objective:   Physical Exam Constitutional:      Appearance: Normal appearance.  HENT:     Head: Normocephalic and atraumatic.  Eyes:     Extraocular Movements: Extraocular movements intact.     Pupils: Pupils are equal, round, and reactive to light.  Cardiovascular:     Rate and Rhythm: Normal rate  and regular rhythm.     Pulses: Normal pulses.     Heart sounds: Normal heart sounds. No murmur. No friction rub.  Pulmonary:     Effort: Pulmonary effort is normal. No respiratory distress.     Breath sounds: Normal breath sounds. No stridor. No wheezing or rhonchi.  Musculoskeletal:     Cervical back: Normal range of motion and neck supple. No rigidity. No muscular tenderness.  Neurological:     Mental Status: She is alert.    Vitals:   03/09/20 1644  BP: 116/66  Pulse: 74  Temp: (!) 97.2 F (36.2 C)  SpO2: 98%   Results of the Epworth flowsheet 05/24/2019  Sitting and reading 3  Watching TV 3  Sitting, inactive in a public place (e.g. a theatre or a meeting) 1  As a passenger in a car for an hour without a break 3  Lying down to rest in the afternoon when circumstances permit 2  Sitting and talking to someone 0  Sitting quietly after a lunch without alcohol 3  In a car, while stopped for a few minutes in traffic 2  Total score 17     Recent sleep study reviewed by myself Compliance data reveals excellent compliance with AHI below 2.7  Assessment:     Severe obstructive sleep apnea -Recent sleep study does reveal severe obstructive sleep apnea -Compliant with CPAP therapy -CPAP compliance shows excellent compliance with AHI of 2.7  Excessive daytime sleepiness -Symptoms are improving  Nonrestorative sleep -Improving symptoms  Obesity    Plan:     Continue CPAP therapy with auto titrating CPAP set at 5-15  I will see her back in the office in 1 year  Encouraged to stay active  Call with any significant concerns

## 2020-03-09 NOTE — Patient Instructions (Signed)
Obstructive sleep apnea Well treated Your compliance data reveals adequate treatment  No changes to make today  Continue CPAP on a regular basis  I will see you in a year Call with any significant concerns

## 2020-03-27 DIAGNOSIS — M75121 Complete rotator cuff tear or rupture of right shoulder, not specified as traumatic: Secondary | ICD-10-CM | POA: Insufficient documentation

## 2020-03-30 NOTE — Telephone Encounter (Signed)
Dr. O, please see pt's mychart message and advise on it. 

## 2020-03-30 NOTE — Telephone Encounter (Signed)
Difficult to know what may be responsible for you just feeling very tired  Are you sleeping enough hours at night Are you exercising during the day orally staying very active  Your sleep apnea treatment usually does not fluctuate week to week, could you think of anything that may be making you more tired or sleepy than usual  We will be glad to see you in the office if needed but I cannot explain a few days of sleepiness with anything that needs to change with your sleep apnea treatment

## 2020-04-07 ENCOUNTER — Other Ambulatory Visit: Payer: Self-pay

## 2020-04-07 NOTE — Telephone Encounter (Signed)
Can you pls confirm, she told me she is taking gabapentin 300mg : 1 cap in AM, 2 caps in PM. Is this accurate so I can send correct Rx? Thanks

## 2020-04-07 NOTE — Telephone Encounter (Signed)
Just to confirm, how is she taking it? Pls update Rx so it reflects how she is taking it and send to [facility] 90-day Rx per patient request, thanks!

## 2020-04-18 DIAGNOSIS — N1831 Chronic kidney disease, stage 3a: Secondary | ICD-10-CM | POA: Insufficient documentation

## 2020-05-01 ENCOUNTER — Telehealth: Payer: Self-pay | Admitting: Pulmonary Disease

## 2020-05-02 ENCOUNTER — Ambulatory Visit: Payer: 59

## 2020-05-08 ENCOUNTER — Inpatient Hospital Stay: Admission: RE | Admit: 2020-05-08 | Payer: 59 | Source: Ambulatory Visit

## 2020-05-17 ENCOUNTER — Other Ambulatory Visit: Payer: Self-pay

## 2020-05-17 ENCOUNTER — Ambulatory Visit
Admission: RE | Admit: 2020-05-17 | Discharge: 2020-05-17 | Disposition: A | Discharge status: Home / Self Care | Payer: 59 | Source: Ambulatory Visit | Attending: Internal Medicine | Admitting: Internal Medicine

## 2020-05-17 ENCOUNTER — Ambulatory Visit: Payer: 59

## 2020-05-17 DIAGNOSIS — Z1231 Encounter for screening mammogram for malignant neoplasm of breast: Secondary | ICD-10-CM

## 2020-05-25 ENCOUNTER — Other Ambulatory Visit: Payer: Self-pay

## 2020-05-25 ENCOUNTER — Ambulatory Visit (INDEPENDENT_AMBULATORY_CARE_PROVIDER_SITE_OTHER): Payer: 59 | Admitting: Podiatry

## 2020-05-25 ENCOUNTER — Encounter: Payer: Self-pay | Admitting: Podiatry

## 2020-05-25 VITALS — Temp 97.8°F

## 2020-05-25 DIAGNOSIS — Q828 Other specified congenital malformations of skin: Secondary | ICD-10-CM | POA: Diagnosis not present

## 2020-05-25 DIAGNOSIS — M722 Plantar fascial fibromatosis: Secondary | ICD-10-CM | POA: Diagnosis not present

## 2020-05-26 ENCOUNTER — Telehealth: Payer: Self-pay | Admitting: Podiatry

## 2020-05-26 ENCOUNTER — Other Ambulatory Visit: Payer: Self-pay | Admitting: Pulmonary Disease

## 2020-05-26 MED ORDER — ARMODAFINIL 150 MG PO TABS: 150.0000 mg | ORAL_TABLET | Freq: Every day | ORAL | 0 refills | Status: DC

## 2020-05-26 NOTE — Progress Notes (Unsigned)
ar

## 2020-05-26 NOTE — Telephone Encounter (Signed)
Can we get a compliance download -To ascertain that CPAP is being used regularly and that obstructive sleep apnea is adequately treated  If obstructive sleep apnea is adequately treated and she is still having significant daytime sleepiness -Treatment for this is a stimulant  Schedule for follow-up with myself or one of the APP's to discuss further -CPAP compliance and adequate amount of sleep is required for stimulants to be covered for excessive daytime sleepiness despite adequate treatment of sleep disordered breathing

## 2020-05-26 NOTE — Telephone Encounter (Signed)
Will place prescription for armodafinil for excessive daytime sleepiness despite adequately treated sleep disordered breathing  She needs to be compliant with CPAP use or else the medication will not be paid for  Patient needs to use her machine for more than 4 hours nightly  6 hours of sleep is required for most adults to not feel sleepy during the day

## 2020-05-26 NOTE — Telephone Encounter (Signed)
Pt called and would like clarification on how to wear splint please assist

## 2020-05-26 NOTE — Telephone Encounter (Signed)
Dr. Wynona Neat, I have copied and pasted the download for you to review.  I will contact patient to schedule an office visit.

## 2020-05-26 NOTE — Telephone Encounter (Signed)
Dr. Wynona Neat, Please see patient's comment from mychart and advise.  Thank you.

## 2020-05-29 ENCOUNTER — Telehealth: Payer: Self-pay | Admitting: Podiatry

## 2020-05-29 NOTE — Telephone Encounter (Signed)
Left message informing pt she should wear the brace as long as she is up and walking but not to bed and until seen in office in 3-4 weeks and to call for that appt [phone].

## 2020-05-29 NOTE — Progress Notes (Signed)
Subjective:   Patient ID: [name] [name], female   DOB: 54 y.o.   MRN: [id number]   HPI Patient presents stating she has had an intensification of discomfort in the bottom of both heels with the right being worse.  States she was doing pretty well and it is gotten worse again over the last month   ROS      Objective:  Physical Exam  Neurovascular status intact with discomfort in the plantar fascial bilateral at the insertion calcaneus with discomfort that is also quite intense when getting up in the morning     Assessment:  Acute fasciitis-like symptomatology bilateral     Plan:  Reviewed condition and the importance of stretching I did dispense a night splint today to try to support the arch and hopefully reduce symptomatology.  I did sterile prep and injected each fashion 3 mg Kenalog 5 mg Xylocaine and patient will be seen back again within the next 3 to 4 weeks and shoe gear modifications recommended

## 2020-05-29 NOTE — Telephone Encounter (Signed)
Pt called and wanted to know how long to wear the brace that she received from  the Dr please assist

## 2020-05-29 NOTE — Telephone Encounter (Signed)
email sent from patient.   Good afternoon Dr.Olalere, I am unable to take the medication Armodafinil for multiple reasons. I am only able to use the CPAP machine for a little over 4 hours nightly due to my rigid medication schedule and pain in multiple areas of my body. I go back to sleep and get 2 to 3 more hours in the morning or later that day. I will keep you posted on my condition.     Sending to Dr. Wynona Neat as Lorain Childes

## 2020-05-30 ENCOUNTER — Telehealth: Payer: Self-pay | Admitting: *Deleted

## 2020-05-30 NOTE — Telephone Encounter (Signed)
Returned patient's phone call on 05/29/20 regarding the night splint they got from Dr. Charlsie Merles.  Patient wanted clarification on how to wear night splint. I explained to patient to wear it when foot hurts, put an ice pack on area for 20 minutes, then take ice pack out. To make sure to keep foot elevated while wearing night splint. Patient can wear during the day and also at night time.  Patient stated they understood.

## 2020-06-15 ENCOUNTER — Other Ambulatory Visit: Payer: Self-pay

## 2020-06-15 MED ORDER — BACLOFEN 20 MG PO TABS: 20.0000 mg | ORAL_TABLET | Freq: Three times a day (TID) | ORAL | 0 refills | Status: DC

## 2020-06-19 ENCOUNTER — Ambulatory Visit: Payer: 59 | Admitting: Podiatry

## 2020-06-26 MED ORDER — BACLOFEN 20 MG PO TABS: 20.0000 mg | ORAL_TABLET | Freq: Three times a day (TID) | ORAL | 1 refills | Status: DC

## 2020-06-28 ENCOUNTER — Ambulatory Visit (INDEPENDENT_AMBULATORY_CARE_PROVIDER_SITE_OTHER): Payer: 59 | Admitting: Podiatry

## 2020-06-28 ENCOUNTER — Encounter: Payer: Self-pay | Admitting: Podiatry

## 2020-06-28 VITALS — Temp 96.6°F

## 2020-06-28 DIAGNOSIS — Q828 Other specified congenital malformations of skin: Secondary | ICD-10-CM | POA: Diagnosis not present

## 2020-06-28 DIAGNOSIS — M722 Plantar fascial fibromatosis: Secondary | ICD-10-CM

## 2020-07-04 NOTE — Progress Notes (Signed)
Subjective:   Patient ID: [name] [name], female   DOB: 54 y.o.   MRN: [id number]   HPI Patient states that her feet seem to be improved quite a bit still having some discomfort and also has lesions underneath the left foot and the right foot and states those can get tender.  Patient states the night splint has also been beneficial   ROS      Objective:  Physical Exam  Neurovascular status intact with patient found to have inflammation of the heel that is improved but still present with night splint being great benefit and lesion formation bilateral     Assessment:  Acute plantar fasciitis bilateral improving with lesion formation bilateral     Plan:  H&P reviewed Planter fasciitis continuation of night splint and gave advice on how to modify what she is doing.  Debrided lesions bilateral no iatrogenic bleeding and to be seen back for routine care

## 2020-08-08 ENCOUNTER — Other Ambulatory Visit: Payer: Self-pay

## 2020-08-08 MED ORDER — GABAPENTIN 300 MG PO CAPS: ORAL_CAPSULE | ORAL | 0 refills | Status: DC

## 2020-08-17 ENCOUNTER — Other Ambulatory Visit: Payer: Self-pay

## 2020-08-17 MED ORDER — GABAPENTIN 300 MG PO CAPS: ORAL_CAPSULE | ORAL | 0 refills | Status: DC

## 2020-08-21 ENCOUNTER — Telehealth: Payer: Self-pay | Admitting: Neurology

## 2020-08-21 ENCOUNTER — Other Ambulatory Visit: Payer: Self-pay

## 2020-08-21 MED ORDER — GABAPENTIN 300 MG PO CAPS: ORAL_CAPSULE | ORAL | 0 refills | Status: DC

## 2020-08-22 ENCOUNTER — Telehealth: Payer: Self-pay

## 2020-08-22 ENCOUNTER — Other Ambulatory Visit: Payer: Self-pay

## 2020-08-22 MED ORDER — GABAPENTIN 300 MG PO CAPS: ORAL_CAPSULE | ORAL | 0 refills | Status: DC

## 2020-08-22 NOTE — Telephone Encounter (Signed)
Pt called stated that [facility] is not getting our escripts new script called in and pharmacy called [phone] ref number [id number] 08/22/20

## 2020-08-25 ENCOUNTER — Other Ambulatory Visit: Payer: Self-pay

## 2020-08-25 ENCOUNTER — Telehealth: Payer: Self-pay | Admitting: Neurology

## 2020-08-25 MED ORDER — GABAPENTIN 300 MG PO CAPS: ORAL_CAPSULE | ORAL | 0 refills | Status: DC

## 2020-08-25 NOTE — Telephone Encounter (Signed)
I'm seeing her next week, can you pls call her and see if she has enough to last until our visit (just in case we need to change dose). Thanks!

## 2020-08-25 NOTE — Telephone Encounter (Signed)
[facility] representative called in stating the patient is trying to get a 90 day prescription of her gabapentin from them, but they only have a 30 day prescription. Can a 90 day prescription be sent in?

## 2020-08-25 NOTE — Telephone Encounter (Signed)
Pt called informed that 90 script was sent to pharmacy

## 2020-08-28 MED ORDER — GABAPENTIN 300 MG PO CAPS: ORAL_CAPSULE | ORAL | 3 refills | Status: DC

## 2020-08-29 ENCOUNTER — Other Ambulatory Visit: Payer: Self-pay

## 2020-08-29 ENCOUNTER — Ambulatory Visit (INDEPENDENT_AMBULATORY_CARE_PROVIDER_SITE_OTHER): Payer: 59 | Admitting: Neurology

## 2020-08-29 ENCOUNTER — Encounter: Payer: Self-pay | Admitting: Neurology

## 2020-08-29 VITALS — BP 142/84 | HR 83 | Resp 18 | Ht 67.0 in | Wt 190.0 lb

## 2020-08-29 DIAGNOSIS — G43009 Migraine without aura, not intractable, without status migrainosus: Secondary | ICD-10-CM

## 2020-08-29 DIAGNOSIS — M5416 Radiculopathy, lumbar region: Secondary | ICD-10-CM

## 2020-08-29 DIAGNOSIS — G40309 Generalized idiopathic epilepsy and epileptic syndromes, not intractable, without status epilepticus: Secondary | ICD-10-CM

## 2020-08-29 DIAGNOSIS — M5412 Radiculopathy, cervical region: Secondary | ICD-10-CM

## 2020-08-29 MED ORDER — BACLOFEN 20 MG PO TABS: 20.0000 mg | ORAL_TABLET | Freq: Three times a day (TID) | ORAL | 3 refills | Status: DC

## 2020-08-29 MED ORDER — LAMOTRIGINE 200 MG PO TABS: 200.0000 mg | ORAL_TABLET | Freq: Two times a day (BID) | ORAL | 3 refills | Status: DC

## 2020-08-29 NOTE — Patient Instructions (Signed)
Good to see you! Continue all your medications. See how you feel with your back, let us know if you would like to proceed with physical therapy. Follow-up in 6-8 months, call for any changes.  Seizure Precautions: 1. If medication has been prescribed for you to prevent seizures, take it exactly as directed.  Do not stop taking the medicine without talking to your doctor first, even if you have not had a seizure in a long time.   2. Avoid activities in which a seizure would cause danger to yourself or to others.  Don't operate dangerous machinery, swim alone, or climb in high or dangerous places, such as on ladders, roofs, or girders.  Do not drive unless your doctor says you may.  3. If you have any warning that you may have a seizure, lay down in a safe place where you can't hurt yourself.    4.  No driving for 6 months from last seizure, as per [facility].   Please refer to the following link on the Epilepsy Foundation of America's website for more information: http://www.epilepsyfoundation.org/answerplace/Social/driving/drivingu.cfm   5.  Maintain good sleep hygiene. Avoid alcohol.  6.  Contact your doctor if you have any problems that may be related to the medicine you are taking.  7.  Call 911 and bring the patient back to the ED if:        A.  The seizure lasts longer than 5 minutes.       B.  The patient doesn't awaken shortly after the seizure  C.  The patient has new problems such as difficulty seeing, speaking or moving  D.  The patient was injured during the seizure  E.  The patient has a temperature over 102 F (39C)  F.  The patient vomited and now is having trouble breathing
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
## 2020-10-14 ENCOUNTER — Other Ambulatory Visit: Payer: Self-pay | Admitting: Physician Assistant

## 2020-12-06 ENCOUNTER — Ambulatory Visit: Payer: 59 | Attending: Internal Medicine

## 2020-12-06 ENCOUNTER — Other Ambulatory Visit (HOSPITAL_COMMUNITY): Payer: Self-pay | Admitting: Internal Medicine

## 2020-12-06 DIAGNOSIS — Z23 Encounter for immunization: Secondary | ICD-10-CM

## 2020-12-06 NOTE — Progress Notes (Signed)
   Covid-19 Vaccination Clinic  Name:  [name] [name]    MRN: [id number] DOB: Oct 13, 1966  12/06/2020  [name] [name] was observed post Covid-19 immunization for 15 minutes without incident. She was provided with Vaccine Information Sheet and instruction to access the V-Safe system.   [name] [name] was instructed to call 911 with any severe reactions post vaccine: Marland Kitchen Difficulty breathing  . Swelling of face and throat  . A fast heartbeat  . A bad rash all over body  . Dizziness and weakness   Immunizations Administered    Name Date Dose VIS Date Route   Pfizer COVID-19 Vaccine 12/06/2020 11:57 AM 0.3 mL 09/06/2020 Intramuscular   Manufacturer: [employer], [employer]   Lot: [id number]   NDC: [address]-6

## 2020-12-14 ENCOUNTER — Ambulatory Visit: Payer: 59 | Admitting: Podiatry

## 2021-01-04 ENCOUNTER — Other Ambulatory Visit: Payer: Self-pay | Admitting: Internal Medicine

## 2021-01-04 DIAGNOSIS — Z1231 Encounter for screening mammogram for malignant neoplasm of breast: Secondary | ICD-10-CM

## 2021-01-04 DIAGNOSIS — Z13820 Encounter for screening for osteoporosis: Secondary | ICD-10-CM

## 2021-01-11 ENCOUNTER — Ambulatory Visit: Payer: 59 | Admitting: Podiatry

## 2021-01-25 ENCOUNTER — Ambulatory Visit (INDEPENDENT_AMBULATORY_CARE_PROVIDER_SITE_OTHER): Payer: 59

## 2021-01-25 ENCOUNTER — Other Ambulatory Visit: Payer: Self-pay

## 2021-01-25 ENCOUNTER — Ambulatory Visit (INDEPENDENT_AMBULATORY_CARE_PROVIDER_SITE_OTHER): Payer: 59 | Admitting: Podiatry

## 2021-01-25 DIAGNOSIS — M2041 Other hammer toe(s) (acquired), right foot: Secondary | ICD-10-CM

## 2021-01-25 DIAGNOSIS — M2042 Other hammer toe(s) (acquired), left foot: Secondary | ICD-10-CM | POA: Diagnosis not present

## 2021-01-25 DIAGNOSIS — M779 Enthesopathy, unspecified: Secondary | ICD-10-CM | POA: Diagnosis not present

## 2021-01-25 DIAGNOSIS — M205X1 Other deformities of toe(s) (acquired), right foot: Secondary | ICD-10-CM

## 2021-01-25 MED ORDER — TRIAMCINOLONE ACETONIDE 10 MG/ML IJ SUSP
10.0000 mg | Freq: Once | INTRAMUSCULAR | Status: AC
  Administered 2021-01-25: 10 mg

## 2021-01-27 NOTE — Progress Notes (Signed)
Subjective:   Patient ID: [name] [name], female   DOB: 55 y.o.   MRN: [id number]   HPI Patient presents stating he has developed discomfort around her big toe joint right which is inflamed and making it hard for her to walk comfortably.  States that it is become inflamed around the big toe joint recently   ROS      Objective:  Physical Exam  Neurovascular status intact muscle strength was found to be adequate range of motion adequate.  Patient does have structural abnormalities of the big toe joints which is creating stress on the joints and I do believe that is creating some of the inflammatory processes that the patient is experiencing.  It is structural but also inflammatory     Assessment:  Inflammatory capsulitis of the first MPJ right with structural hallux limitus deformity     Plan:  H&P x-ray reviewed condition discussed and at this point organ to try medication but I do think ultimate surgical intervention in this case will be necessary.  I did do sterile prep I injected around the joint surface periarticular 3 mg Dexasone Kenalog 5 mg Xylocaine advised on rigid bottom shoes and reappoint for [place] to recheck 2 weeks  X-rays indicate there is structural abnormality with moderate narrowing of the joint surface

## 2021-02-08 ENCOUNTER — Encounter: Payer: Self-pay | Admitting: Podiatry

## 2021-02-08 ENCOUNTER — Other Ambulatory Visit: Payer: Self-pay

## 2021-02-08 ENCOUNTER — Ambulatory Visit (INDEPENDENT_AMBULATORY_CARE_PROVIDER_SITE_OTHER): Payer: 59 | Admitting: Podiatry

## 2021-02-08 DIAGNOSIS — M205X1 Other deformities of toe(s) (acquired), right foot: Secondary | ICD-10-CM

## 2021-02-11 NOTE — Progress Notes (Signed)
Subjective:   Patient ID: [name] [name], female   DOB: 55 y.o.   MRN: [id number]   HPI Patient states the pain has improved quite a bit right and at this point she is walking with a better heel toe gait pattern   ROS      Objective:  Physical Exam  Neurovascular status intact with inflammation pain of the first MPJ right that is very mild now with mild restriction motion but overall doing well that way     Assessment:  Hallux limitus condition right with inflammatory capsulitis improved with pain still present only upon deep palpation and quite improved from previous visit.  Patient will continue to watch this and will be seen back     Plan:  Very pleased with progress and I do think ultimate surgery still may be possible but we will hold off currently in CHF she does conservatively with rigid bottom shoes not going barefoot

## 2021-02-12 ENCOUNTER — Ambulatory Visit: Payer: 59 | Admitting: Physician Assistant

## 2021-03-26 ENCOUNTER — Ambulatory Visit: Payer: 59 | Admitting: Pulmonary Disease

## 2021-03-26 ENCOUNTER — Encounter: Payer: Self-pay | Admitting: Pulmonary Disease

## 2021-03-26 ENCOUNTER — Other Ambulatory Visit: Payer: Self-pay

## 2021-03-26 ENCOUNTER — Ambulatory Visit (INDEPENDENT_AMBULATORY_CARE_PROVIDER_SITE_OTHER): Payer: 59 | Admitting: Pulmonary Disease

## 2021-03-26 VITALS — BP 134/78 | HR 72 | Temp 98.2°F | Ht 67.0 in | Wt 186.0 lb

## 2021-03-26 DIAGNOSIS — G4733 Obstructive sleep apnea (adult) (pediatric): Secondary | ICD-10-CM | POA: Diagnosis not present

## 2021-03-26 DIAGNOSIS — Z9989 Dependence on other enabling machines and devices: Secondary | ICD-10-CM

## 2021-03-26 NOTE — Patient Instructions (Signed)
Severe obstructive sleep apnea adequately treated with CPAP therapy  Continue using the CPAP  Call with significant concerns  Follow-up in 6 months  Try and get at least 6 hours of sleep every night

## 2021-03-26 NOTE — Progress Notes (Signed)
Subjective:     Patient ID: [name] [name], female   DOB: September 14, 1966, 55 y.o.   MRN: [id number]  Patient is being seen for obstructive sleep apnea  Has been using CPAP on a regular basis No significant issues with her CPAP recently  Since sleep study showing severe obstructive sleep apnea Has gained some weight recently  Doing well with CPAP Appears to be sleeping better, functioning better with CPAP use  Still does not like it but uses it nightly  Takes about 30 minutes or less to fall asleep Wakes up about once during the night most times sleeps through the night  Usually gets up about 6 to take medications and then may go back to sleep  No dryness of the mouth in the mornings Memory is intact No family history of obstructive sleep apnea  History of chronic neck pain, back surgery in the past      Review of Systems  Constitutional: Positive for unexpected weight change.  HENT: Positive for rhinorrhea.   Eyes: Negative.   Respiratory: Positive for apnea and shortness of breath.   Cardiovascular: Negative for palpitations and leg swelling.  Gastrointestinal: Negative.   Endocrine: Negative.   Genitourinary: Negative.   Psychiatric/Behavioral: Positive for sleep disturbance.   Past Medical History:  Diagnosis Date  . Aortic atherosclerosis ([place])   . Asthma   . Chronic headaches   . CKD (chronic kidney disease), stage II   . Complex partial seizures ([place])   . Dyspnea 12/24/2006   B/P MV - normal perfusion in all regions; post stress LV normal in size; EF 63%; no significant ischemia noted  . Hyperlipidemia   . Hypertension   . Palpitations 01/01/2007   30 day event monitor - 69 pt reported events- some PAC and PVCs reported  . PVC's (premature ventricular contractions)   . Sleep apnea   . SOB (shortness of breath) 04/20/2012   MET test - exercise limiting factor - chronotropic incompetence (medication), peak VO2 76% of predicted; no EKG ST changes noted at  sub-optimal peak HR   Social History   Socioeconomic History  . Marital status: Married    Spouse name: Not on file  . Number of children: Not on file  . Years of education: Not on file  . Highest education level: Not on file  Occupational History  . Not on file  Tobacco Use  . Smoking status: Never Smoker  . Smokeless tobacco: Never Used  Vaping Use  . Vaping Use: Never used  Substance and Sexual Activity  . Alcohol use: No  . Drug use: No  . Sexual activity: Not on file  Other Topics Concern  . Not on file  Social History Narrative   Pt is R handed   Lives in 2 story home with her husband, Girard Cooter.   Has 1 biological child / 1 step child   Associated degree in nursing   disabled   Social Determinants of Health   Financial Resource Strain: Not on file  Food Insecurity: Not on file  Transportation Needs: Not on file  Physical Activity: Not on file  Stress: Not on file  Social Connections: Not on file  Intimate Partner Violence: Not on file   Family History  Problem Relation Age of Onset  . Diabetes Mother   . Hypertension Mother   . Breast cancer Mother 68  . Asthma Brother   . Healthy Brother        Objective:   Physical Exam Constitutional:  Appearance: Normal appearance.  HENT:     Nose: No congestion.  Eyes:     Extraocular Movements: Extraocular movements intact.     Pupils: Pupils are equal, round, and reactive to light.  Cardiovascular:     Rate and Rhythm: Normal rate and regular rhythm.     Pulses: Normal pulses.     Heart sounds: Normal heart sounds. No murmur heard. No friction rub.  Pulmonary:     Effort: Pulmonary effort is normal. No respiratory distress.     Breath sounds: Normal breath sounds. No stridor. No wheezing or rhonchi.  Musculoskeletal:     Cervical back: No rigidity. No muscular tenderness.  Neurological:     Mental Status: She is alert.  Psychiatric:        Mood and Affect: Mood normal.    Vitals:   03/26/21  1614  BP: 134/78  Pulse: 72  Temp: 98.2 F (36.8 C)  SpO2: 100%   Results of the Epworth flowsheet 05/24/2019  Sitting and reading 3  Watching TV 3  Sitting, inactive in a public place (e.g. a theatre or a meeting) 1  As a passenger in a car for an hour without a break 3  Lying down to rest in the afternoon when circumstances permit 2  Sitting and talking to someone 0  Sitting quietly after a lunch without alcohol 3  In a car, while stopped for a few minutes in traffic 2  Total score 17     Recent sleep study reviewed by myself Compliance data reveals excellent compliance with AHI below 2.7  Compliance data for April shows 87% compliance with CPAP Set between 5 and 15 95 percentile pressure of 11.4 AHI 1.6  Assessment:     Severe obstructive sleep apnea -Recent sleep study does reveal severe obstructive sleep apnea -Remains compliant with CPAP therapy -AHI 1.6  Excessive daytime sleepiness -Symptoms are improving  Sleep quality appears to be improving    Plan:     Continue CPAP therapy with auto titrating CPAP set at 5-15  Tentative follow-up in 6 months  Encouraged to stay active  Call with significant concerns

## 2021-03-28 ENCOUNTER — Ambulatory Visit: Payer: 59 | Admitting: Pulmonary Disease

## 2021-04-10 NOTE — Telephone Encounter (Signed)
Received the following message from patient:   "Hi Dr. Wynona Neat, I forgot to tell you recently in the mornings after using my CPAP machine all the moisture is gone from my mouth and throat. This past Sunday was the worst, because it was hard to swallow. I salivate a lot so this is starting to concern me. My nasal passages are starting to burn now."  I have printed a copy of her download and will place it in AO's lookat folder.   AO, can you please advise? Thanks!

## 2021-04-17 NOTE — Telephone Encounter (Signed)
Attempted to call pt to get her an appt scheduled with APP but unable to reach. Left message for her to return call to get appt scheduled.  Will leave encounter open to follow up to see if pt did call back to get appt scheduled.

## 2021-04-17 NOTE — Telephone Encounter (Signed)
Schedule patient for an early follow-up either with me or with an APP

## 2021-04-20 ENCOUNTER — Other Ambulatory Visit: Payer: Self-pay

## 2021-04-20 ENCOUNTER — Ambulatory Visit (INDEPENDENT_AMBULATORY_CARE_PROVIDER_SITE_OTHER): Payer: 59 | Admitting: Neurology

## 2021-04-20 ENCOUNTER — Encounter: Payer: Self-pay | Admitting: Neurology

## 2021-04-20 VITALS — BP 153/90 | HR 89 | Ht 67.0 in | Wt 189.0 lb

## 2021-04-20 DIAGNOSIS — M549 Dorsalgia, unspecified: Secondary | ICD-10-CM | POA: Diagnosis not present

## 2021-04-20 DIAGNOSIS — G43009 Migraine without aura, not intractable, without status migrainosus: Secondary | ICD-10-CM | POA: Diagnosis not present

## 2021-04-20 DIAGNOSIS — G40309 Generalized idiopathic epilepsy and epileptic syndromes, not intractable, without status epilepticus: Secondary | ICD-10-CM | POA: Diagnosis not present

## 2021-04-20 DIAGNOSIS — M542 Cervicalgia: Secondary | ICD-10-CM | POA: Diagnosis not present

## 2021-04-20 DIAGNOSIS — G8929 Other chronic pain: Secondary | ICD-10-CM

## 2021-04-20 NOTE — Patient Instructions (Signed)
Good to see you! Continue all your medications, let us know when you are due for refills. Follow-up with Ortho for neck and back pain. Follow-up in 6-8 months, call for any changes.   Seizure Precautions: 1. If medication has been prescribed for you to prevent seizures, take it exactly as directed.  Do not stop taking the medicine without talking to your doctor first, even if you have not had a seizure in a long time.   2. Avoid activities in which a seizure would cause danger to yourself or to others.  Don't operate dangerous machinery, swim alone, or climb in high or dangerous places, such as on ladders, roofs, or girders.  Do not drive unless your doctor says you may.  3. If you have any warning that you may have a seizure, lay down in a safe place where you can't hurt yourself.    4.  No driving for 6 months from last seizure, as per [facility].   Please refer to the following link on the Epilepsy Foundation of America's website for more information: http://www.epilepsyfoundation.org/answerplace/Social/driving/drivingu.cfm   5.  Maintain good sleep hygiene.  6.  Contact your doctor if you have any problems that may be related to the medicine you are taking.  7.  Call 911 and bring the patient back to the ED if:        A.  The seizure lasts longer than 5 minutes.       B.  The patient doesn't awaken shortly after the seizure  C.  The patient has new problems such as difficulty seeing, speaking or moving  D.  The patient was injured during the seizure  E.  The patient has a temperature over 102 F (39C)  F.  The patient vomited and now is having trouble breathing
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
## 2021-05-01 ENCOUNTER — Other Ambulatory Visit: Payer: Self-pay

## 2021-05-01 ENCOUNTER — Encounter: Payer: Self-pay | Admitting: Cardiovascular Disease

## 2021-05-01 ENCOUNTER — Encounter: Payer: Self-pay | Admitting: *Deleted

## 2021-05-01 ENCOUNTER — Other Ambulatory Visit: Payer: Self-pay | Admitting: Physician Assistant

## 2021-05-01 ENCOUNTER — Ambulatory Visit (INDEPENDENT_AMBULATORY_CARE_PROVIDER_SITE_OTHER): Payer: 59 | Admitting: Cardiovascular Disease

## 2021-05-01 VITALS — BP 126/78 | HR 73 | Ht 67.0 in

## 2021-05-01 DIAGNOSIS — I10 Essential (primary) hypertension: Secondary | ICD-10-CM

## 2021-05-01 DIAGNOSIS — I700 Atherosclerosis of aorta: Secondary | ICD-10-CM

## 2021-05-01 DIAGNOSIS — R002 Palpitations: Secondary | ICD-10-CM | POA: Diagnosis not present

## 2021-05-01 DIAGNOSIS — I493 Ventricular premature depolarization: Secondary | ICD-10-CM

## 2021-05-01 HISTORY — DX: Atherosclerosis of aorta: I70.0

## 2021-05-01 NOTE — Progress Notes (Signed)
Cardiology Office Note:    Date:  05/01/2021   ID:  [name] [name], DOB January 24, 1966, MRN [id number]  PCP:  Karleen Hampshire., MD   [facility] Providers Cardiologist:  Ena Dawley, MD (Inactive)     Referring MD: Karleen Hampshire., MD   Chief Complaint  Patient presents with   Follow-up   Shortness of Breath   Edema    Ankles.    History of Present Illness:    [name] [name] is a 55 y.o. female with a hx of aortic atherosclerosis, asthma, CKD stage II, complex partial seizures, hyperlipidemia, hypertension, PAC's/PVC's, and obstructive sleep apnea, who presents for follow-up. Ms. [name] was a patient of Dr. Francesca Oman who wishes to transfer to my care. She was last seen by Dr. Meda Coffee on 01/23/2018 with cc of chest pain and exertional dyspnea. Most recently she was seen by Levell July, Brooke Bonito., NP on 12/24/2019 for follow-up of hypertension management. At that visit her blood pressure was improving and she was advised to continue taking hydralazine 25 mg bid, and continue Lasix 20 mg daily. She was previously on diltiazem for PAC's and PVC's.   Today, she is accompanied by her husband. Overall, she has been feeling okay. She can tell when her heart rhythm changes. When walking from one room to another, she will feel palpitations that "beat out of her chest." Previously, these palpitations lasted for 15 minutes. However, in the last couple of months they have started lasting for an hour. Her palpitations are intermittent during the one hour episode. The palpitations can occur when she moves from sitting to standing as well. At home, her blood pressure typically averages in the 130s/70s-80s. Her normal resting heart rate averages in the 60s-70s, but increases when she begins to walk around. She does have some LE edema and is currently wearing compression stockings. Also, she occasionally suffers episodes of sudden lightheadedness that causes her to stop and hold something for balance. She  attributes these episodes to a previous spinal cord injury, and this is followed by her neurologist. Due to her spinal cord injury, she is severely limited in terms of exercise. She can only walk for a short time before her legs give out. Twice a day, she will try to walk from one end of a room to the other end for 30 minutes. For her diet, she usually does not eat that much in a day, but is still gaining some weight. Of note, she reports that her skin is very sensitive to bandages and leads, causing inflammation and scarring. She denies any chest pain, or shortness of breath. No headaches or syncope to report. Also has no orthopnea or PND.   Past Medical History:  Diagnosis Date   Aortic atherosclerosis (HCC)    Aortic atherosclerosis ([place]) 05/01/2021   Asthma    Chronic headaches    CKD (chronic kidney disease), stage II    Complex partial seizures (HCC)    Dyspnea 12/24/2006   B/P MV - normal perfusion in all regions; post stress LV normal in size; EF 63%; no significant ischemia noted   Hyperlipidemia    Hypertension    Palpitations 01/01/2007   30 day event monitor - 69 pt reported events- some PAC and PVCs reported   PVC's (premature ventricular contractions)    Sleep apnea    SOB (shortness of breath) 04/20/2012   MET test - exercise limiting factor - chronotropic incompetence (medication), peak VO2 76% of predicted; no EKG ST changes noted  at sub-optimal peak HR    Past Surgical History:  Procedure Laterality Date   ABDOMINAL HYSTERECTOMY  02/2003   ACD  04/24/2010   Done at [facility] --C2-3 and C6 and C7   ACDF N/A 05/01/2007   Done at [facility] --C5 to [place]  05/2004   CERVICAL DISC SURGERY  12/01/2002   C4-C5 fusion and bone graft    Current Medications: Current Meds  Medication Sig   acetaminophen (TYLENOL) 500 MG tablet Take 1,000 mg by mouth every 6 (six) hours as needed for pain (pain).   aspirin 81 MG tablet Take 81 mg by mouth daily.   baclofen  (LIORESAL) 20 MG tablet Take 1 tablet (20 mg total) by mouth 3 (three) times daily.   betamethasone dipropionate 0.05 % cream    COVID-19 mRNA vaccine, Pfizer, 30 MCG/0.3ML injection USE AS DIRECTED   diltiazem (CARDIZEM) 120 MG tablet Take 240 mg by mouth 2 (two) times daily.   furosemide (LASIX) 20 MG tablet    gabapentin (NEURONTIN) 300 MG capsule TAKE 1 CAPSULE BY MOUTH IN  THE MORNING AND 2 TO 3  CAPSULES IN THE EVENING   hydrALAZINE (APRESOLINE) 25 MG tablet TAKE 1 TABLET BY MOUTH  TWICE DAILY   lamoTRIgine (LAMICTAL) 200 MG tablet Take 1 tablet (200 mg total) by mouth 2 (two) times daily.   Multiple Vitamins-Minerals (MULTIVITAMIN-MINERALS PO) Take by mouth.   NON FORMULARY Take 1,000 mg by mouth daily. [facility] Vitamin D   omeprazole (PRILOSEC OTC) 20 MG tablet Take 20 mg by mouth daily.   Probiotic Product (ALIGN PO) Take 1 tablet by mouth daily.     Allergies:   Camphor-menthol, Fish allergy, Fish-derived products, [place] sensitive, Wheat bran, Adhesive [tape], Sulfa antibiotics, Sulfacetamide sodium, Sulfasalazine, Halog [halcinonide], Other, Ketoconazole, and Triamcinolone   Social History   Socioeconomic History   Marital status: Married    Spouse name: Not on file   Number of children: Not on file   Years of education: Not on file   Highest education level: Not on file  Occupational History   Not on file  Tobacco Use   Smoking status: Never   Smokeless tobacco: Never  Vaping Use   Vaping Use: Never used  Substance and Sexual Activity   Alcohol use: No   Drug use: No   Sexual activity: Not on file  Other Topics Concern   Not on file  Social History Narrative   Pt is R handed   Lives in 2 story home with her husband, Girard Cooter.   Has 1 biological child / 1 step child   Associated degree in nursing   disabled   Social Determinants of Health   Financial Resource Strain: Not on file  Food Insecurity: Not on file  Transportation Needs: Not on file  Physical Activity:  Not on file  Stress: Not on file  Social Connections: Not on file     Family History: The patient's family history includes Asthma in her brother; Breast cancer (age of onset: 71) in her mother; Diabetes in her mother; Healthy in her brother; Hypertension in her mother.  ROS:   Please see the history of present illness.    (+) Palpitations (+) LE edema (+) Lightheadedness (+) Imbalance All other systems reviewed and are negative.  EKGs/Labs/Other Studies Reviewed:    The following studies were reviewed today:  Echo 11/04/2019:  1. Left ventricular ejection fraction, by visual estimation, is 65 to  70%. The left  ventricle has normal function. There is borderline left  ventricular hypertrophy.   2. Left ventricular diastolic parameters are consistent with Grade I  diastolic dysfunction (impaired relaxation).   3. The left ventricle has no regional wall motion abnormalities.   4. Global right ventricle has normal systolic function.The right  ventricular size is normal. No increase in right ventricular wall  thickness.   5. Left atrial size was normal.   6. Right atrial size was normal.   7. The mitral valve is normal in structure. Mild mitral valve  regurgitation. No evidence of mitral stenosis.   8. The tricuspid valve is normal in structure. Tricuspid valve  regurgitation is mild.   9. The aortic valve is normal in structure. Aortic valve regurgitation is  not visualized. No evidence of aortic valve sclerosis or stenosis.  10. The pulmonic valve was normal in structure. Pulmonic valve  regurgitation is not visualized.  11. Mildly elevated pulmonary artery systolic pressure.  12. The tricuspid regurgitant velocity is 2.61 m/s, and with an assumed  right atrial pressure of 3 mmHg, the estimated right ventricular systolic  pressure is mildly elevated at 30.2 mmHg.  13. The inferior vena cava is normal in size with greater than 50%  respiratory variability, suggesting right  atrial pressure of 3 mmHg.   Holter Monitor 02/09/2018: Sinus bradycardia to sinus rhythm. No PVCs, PACs no runs. Completely normal 48 hour Holter monitor. No arrhythmias or pauses.  Echo 09/16/2016: - Left ventricle: The cavity size was normal. Systolic function was    normal. The estimated ejection fraction was in the range of 60%    to 65%. Wall motion was normal; there were no regional wall    motion abnormalities. Left ventricular diastolic function    parameters were normal.  - Aortic valve: Trileaflet; normal thickness leaflets. There was no    regurgitation.  - Aortic root: The aortic root was normal in size.  - Mitral valve: Structurally normal valve. There was no    regurgitation.  - Left atrium: The atrium was normal in size.  - Right ventricle: Systolic function was normal.  - Right atrium: The atrium was normal in size.  - Tricuspid valve: There was mild regurgitation.  - Pulmonic valve: There was no regurgitation.  - Pulmonary arteries: Systolic pressure was within the normal    range.  - Inferior vena cava: The vessel was normal in size.  - Pericardium, extracardiac: There was no pericardial effusion.   ETT 08/31/2015: There was no ST segment deviation noted during stress. Fair exercise capacity. Exercise limited by back pain (hx of 3 surgeries) No chest pain. She did complain of dizziness after exercise was ended. Normal BP response to exercise. No ST changes to suggest ischemia at submaximal exercise. No exercise induced arrhythmias.  EKG:    05/01/2021: Sinus Rhythm. Rate 73 bpm.   Recent Labs: No results found for requested labs within last 8760 hours.  Recent Lipid Panel No results found for: CHOL, TRIG, HDL, CHOLHDL, VLDL, LDLCALC, LDLDIRECT   Physical Exam:    VS:  BP 126/78 (BP Location: Left Arm, Patient Position: Sitting, Cuff Size: Normal)   Pulse 73   Ht '5\' 7"'  (1.702 m)   BMI 29.60 kg/m  , BMI Body mass index is 29.6 kg/m. GENERAL:   Well appearing HEENT: Pupils equal round and reactive, fundi not visualized, oral mucosa unremarkable NECK:  No jugular venous distention, waveform within normal limits, carotid upstroke brisk and symmetric, no bruits LUNGS:  Clear to auscultation bilaterally HEART:  RRR.  PMI not displaced or sustained,S1 and S2 within normal limits, no S3, no S4, no clicks, no rubs, no murmurs ABD:  Flat, positive bowel sounds normal in frequency in pitch, no bruits, no rebound, no guarding, no midline pulsatile mass, no hepatomegaly, no splenomegaly EXT:  2 plus pulses throughout, no edema, no cyanosis no clubbing SKIN:  No rashes no nodules NEURO:  Cranial nerves II through XII grossly intact, motor grossly intact throughout PSYCH:  Cognitively intact, oriented to person place and time   ASSESSMENT:    1. PVC's (premature ventricular contractions)   2. Essential hypertension   3. Palpitations    PLAN:   Essential hypertension Blood pressure is well-controlled on hydralazine and diltiazem.  Continue current regimen.  Palpitations She has a known history of PACs and PVCs.  On her 2 most recent monitors there were either no arrhythmias or very few PACs.  It seems that she is having many more symptoms lately.  We will get a 3-day ZIO to better assess.  Continue diltiazem for now.  Check a BMP and magnesium today.  Aortic atherosclerosis ([place]) Lipids have been poorly controlled.  She is not very active because of a spinal cord injury.  She is interested in seeing what kind of exercises she can do without causing pain.  We will refer her to the PREP program at the [facility].  If lipids remain elevated in the future we will need to consider a statin.   Disposition: FU with APP in 2 months. FU with Tiffany C. Oval Linsey, MD, [facility] in 6 months.   Medication Adjustments/Labs and Tests Ordered: Current medicines are reviewed at length with the patient today.  Concerns regarding medicines are outlined above.   Orders Placed This Encounter  Procedures   CBC with Differential/Platelet   T4, free   TSH   Comprehensive metabolic panel   Magnesium   CARDIAC EVENT MONITOR   EKG 12-Lead    No orders of the defined types were placed in this encounter.   Patient Instructions  Medication Instructions:  Your physician recommends that you continue on your current medications as directed. Please refer to the Current Medication list given to you today.  *If you need a refill on your cardiac medications before your next appointment, please call your pharmacy*  Lab Work: TSH/FT4/MAGNESIUM/CMET/CBC TODAY   If you have labs (blood work) drawn today and your tests are completely normal, you will receive your results only by: [place] (if you have MyChart) OR A paper copy in the mail If you have any lab test that is abnormal or we need to change your treatment, we will call you to review the results.  Testing/Procedures: Your physician has recommended that you wear an event monitor. Event monitors are medical devices that record the heart's electrical activity. Doctors most often [place] these monitors to diagnose arrhythmias. Arrhythmias are problems with the speed or rhythm of the heartbeat. The monitor is a small, portable device. You can wear one while you do your normal daily activities. This is usually used to diagnose what is causing palpitations/syncope (passing out). 3 DAYS ONLY   Follow-Up: At [facility], you and your health needs are our priority.  As part of our continuing mission to provide you with exceptional heart care, we have created designated Provider Care Teams.  These Care Teams include your primary Cardiologist (physician) and Advanced Practice Providers (APPs -  Physician Assistants and Nurse Practitioners) who all  work together to provide you with the care you need, when you need it.  We recommend signing up for the patient portal called "MyChart".  Sign up information is  provided on this After Visit Summary.  MyChart is used to connect with patients for Virtual Visits (Telemedicine).  Patients are able to view lab/test results, encounter notes, upcoming appointments, etc.  Non-urgent messages can be sent to your provider as well.   To learn more about what you can do with MyChart, go to [email or web address].    Your next appointment:   2 month(s)  The format for your next appointment:   In Person  Provider:   CAITLIN NP [place]   Your physician recommends that you schedule a follow-up appointment in: [facility] IN 6 MONTHS  Other Instructions  [employer] Cardiac Event Monitor Instructions Your physician has requested you wear your cardiac event monitor for _3_ days, (1-30). [employer] may call or text to confirm a shipping address. The monitor will be sent to a land address via UPS. [employer] will not ship a monitor to a PO BOX. It typically takes 3-5 days to receive your monitor after it has been enrolled. [employer] will assist with USPS tracking if your package is delayed. The telephone number for [employer] is [phone]. Once you have received your monitor, please review the enclosed instructions. Instruction tutorials can also be viewed under help and settings on the enclosed cell phone. Your monitor has already been registered assigning a specific monitor serial # to you.  Applying the monitor Remove cell phone from case and turn it on. The cell phone works as Dealer and needs to be within [employer] of you at all times. The cell phone will need to be charged on a daily basis. We recommend you plug the cell phone into the enclosed charger at your bedside table every night.  Monitor batteries: You will receive two monitor batteries labelled #1 and #2. These are your recorders. Plug battery #2 onto the second connection on the enclosed charger. Keep one battery on the charger at all times. This will keep the monitor  battery deactivated. It will also keep it fully charged for when you need to switch your monitor batteries. A small light will be blinking on the battery emblem when it is charging. The light on the battery emblem will remain on when the battery is fully charged.  Open package of a Monitor strip. Insert battery #1 into black hood on strip and gently squeeze monitor battery onto connection as indicated in instruction booklet. Set aside while preparing skin.  Choose location for your strip, vertical or horizontal, as indicated in the instruction booklet. Shave to remove all hair from location. There cannot be any lotions, oils, powders, or colognes on skin where monitor is to be applied. Wipe skin clean with enclosed Saline wipe. Dry skin completely.  Peel paper labeled #1 off the back of the Monitor strip exposing the adhesive. Place the monitor on the chest in the vertical or horizontal position shown in the instruction booklet. One arrow on the monitor strip must be pointing upward. Carefully remove paper labeled #2, attaching remainder of strip to your skin. Try not to create any folds or wrinkles in the strip as you apply it.  Firmly press and release the circle in the center of the monitor battery. You will hear a small beep. This is turning the monitor battery on. The heart emblem on the monitor battery will light up every 5  seconds if the monitor battery in turned on and connected to the patient securely. Do not push and hold the circle down as this turns the monitor battery off. The cell phone will locate the monitor battery. A screen will appear on the cell phone checking the connection of your monitor strip. This may read poor connection initially but change to good connection within the next minute. Once your monitor accepts the connection you will hear a series of 3 beeps followed by a climbing crescendo of beeps. A screen will appear on the cell phone showing the two monitor strip  placement options. Touch the picture that demonstrates where you applied the monitor strip.  Your monitor strip and battery are waterproof. You are able to shower, bathe, or swim with the monitor on. They just ask you do not submerge deeper than 3 feet underwater. We recommend removing the monitor if you are swimming in a lake, river, or ocean.  Your monitor battery will need to be switched to a fully charged monitor battery approximately once a week. The cell phone will alert you of an action which needs to be made.  On the cell phone, tap for details to reveal connection status, monitor battery status, and cell phone battery status. The green dots indicates your monitor is in good status. A red dot indicates there is something that needs your attention.  To record a symptom, click the circle on the monitor battery. In 30-60 seconds a list of symptoms will appear on the cell phone. Select your symptom and tap save. Your monitor will record a sustained or significant arrhythmia regardless of you clicking the button. Some patients do not feel the heart rhythm irregularities. [employer] will notify us of any serious or critical events.  Refer to instruction booklet for instructions on switching batteries, changing strips, the Do not disturb or Pause features, or any additional questions.  Call [employer] at [phone], to confirm your monitor is transmitting and record your baseline. They will answer any questions you may have regarding the monitor instructions at that time.  Returning the monitor to [place] all equipment back into blue box. Peel off strip of paper to expose adhesive and close box securely. There is a prepaid UPS shipping label on this box. Drop in a UPS drop box, or at a UPS facility like [employer]. You may also contact [employer] to arrange UPS to pick up monitor package at your home.   I,Mathew Stumpf,acting as a Education administrator for Skeet Latch, MD.,have  documented all relevant documentation on the behalf of Skeet Latch, MD,as directed by  Skeet Latch, MD while in the presence of Skeet Latch, MD.  I, Holloway Oval Linsey, MD have reviewed all documentation for this visit.  The documentation of the exam, diagnosis, procedures, and orders on 05/01/2021 are all accurate and complete.   Signed, Skeet Latch, MD  05/01/2021 5:56 PM    [place]

## 2021-05-01 NOTE — Progress Notes (Unsigned)
Patient ID: [name] [name], female   DOB: October 30, 1966, 55 y.o.   MRN: [id number] Patient enrolled for [employer] to ship a 3 day cardiac event monitor with sensitive skin setup to PO BOX address on file.

## 2021-05-01 NOTE — Assessment & Plan Note (Signed)
Blood pressure is well-controlled on hydralazine and diltiazem.  Continue current regimen.

## 2021-05-01 NOTE — Assessment & Plan Note (Signed)
She has a known history of PACs and PVCs.  On her 2 most recent monitors there were either no arrhythmias or very few PACs.  It seems that she is having many more symptoms lately.  We will get a 3-day ZIO to better assess.  Continue diltiazem for now.  Check a BMP and magnesium today.

## 2021-05-01 NOTE — Patient Instructions (Signed)
Medication Instructions:  Your physician recommends that you continue on your current medications as directed. Please refer to the Current Medication list given to you today.  *If you need a refill on your cardiac medications before your next appointment, please call your pharmacy*  Lab Work: TSH/FT4/MAGNESIUM/CMET/CBC TODAY   If you have labs (blood work) drawn today and your tests are completely normal, you will receive your results only by: MyChart Message (if you have MyChart) OR A paper copy in the mail If you have any lab test that is abnormal or we need to change your treatment, we will call you to review the results.  Testing/Procedures: Your physician has recommended that you wear an event monitor. Event monitors are medical devices that record the heart's electrical activity. Doctors most often [place] these monitors to diagnose arrhythmias. Arrhythmias are problems with the speed or rhythm of the heartbeat. The monitor is a small, portable device. You can wear one while you do your normal daily activities. This is usually used to diagnose what is causing palpitations/syncope (passing out). 3 DAYS ONLY   Follow-Up: At [facility], you and your health needs are our priority.  As part of our continuing mission to provide you with exceptional heart care, we have created designated Provider Care Teams.  These Care Teams include your primary Cardiologist (physician) and Advanced Practice Providers (APPs -  Physician Assistants and Nurse Practitioners) who all work together to provide you with the care you need, when you need it.  We recommend signing up for the patient portal called "MyChart".  Sign up information is provided on this After Visit Summary.  MyChart is used to connect with patients for Virtual Visits (Telemedicine).  Patients are able to view lab/test results, encounter notes, upcoming appointments, etc.  Non-urgent messages can be sent to your provider as well.   To learn more  about what you can do with MyChart, go to [email or web address].    Your next appointment:   2 month(s)  The format for your next appointment:   In Person  Provider:   CAITLIN NP [facility]   Your physician recommends that you schedule a follow-up appointment in: DR [facility] IN 6 MONTHS  Other Instructions  [employer] Cardiac Event Monitor Instructions Your physician has requested you wear your cardiac event monitor for _3_ days, (1-30). [employer] may call or text to confirm a shipping address. The monitor will be sent to a land address via UPS. [employer] will not ship a monitor to a PO BOX. It typically takes 3-5 days to receive your monitor after it has been enrolled. [employer] will assist with USPS tracking if your package is delayed. The telephone number for [employer] is [phone]. Once you have received your monitor, please review the enclosed instructions. Instruction tutorials can also be viewed under help and settings on the enclosed cell phone. Your monitor has already been registered assigning a specific monitor serial # to you.  Applying the monitor Remove cell phone from case and turn it on. The cell phone works as IT consultant and needs to be within [employer] of you at all times. The cell phone will need to be charged on a daily basis. We recommend you plug the cell phone into the enclosed charger at your bedside table every night.  Monitor batteries: You will receive two monitor batteries labelled #1 and #2. These are your recorders. Plug battery #2 onto the second connection on the enclosed charger. Keep one battery on the charger at all  times. This will keep the monitor battery deactivated. It will also keep it fully charged for when you need to switch your monitor batteries. A small light will be blinking on the battery emblem when it is charging. The light on the battery emblem will remain on when the battery is fully charged.  Open package  of a Monitor strip. Insert battery #1 into black hood on strip and gently squeeze monitor battery onto connection as indicated in instruction booklet. Set aside while preparing skin.  Choose location for your strip, vertical or horizontal, as indicated in the instruction booklet. Shave to remove all hair from location. There cannot be any lotions, oils, powders, or colognes on skin where monitor is to be applied. Wipe skin clean with enclosed Saline wipe. Dry skin completely.  Peel paper labeled #1 off the back of the Monitor strip exposing the adhesive. Place the monitor on the chest in the vertical or horizontal position shown in the instruction booklet. One arrow on the monitor strip must be pointing upward. Carefully remove paper labeled #2, attaching remainder of strip to your skin. Try not to create any folds or wrinkles in the strip as you apply it.  Firmly press and release the circle in the center of the monitor battery. You will hear a small beep. This is turning the monitor battery on. The heart emblem on the monitor battery will light up every 5 seconds if the monitor battery in turned on and connected to the patient securely. Do not push and hold the circle down as this turns the monitor battery off. The cell phone will locate the monitor battery. A screen will appear on the cell phone checking the connection of your monitor strip. This may read poor connection initially but change to good connection within the next minute. Once your monitor accepts the connection you will hear a series of 3 beeps followed by a climbing crescendo of beeps. A screen will appear on the cell phone showing the two monitor strip placement options. Touch the picture that demonstrates where you applied the monitor strip.  Your monitor strip and battery are waterproof. You are able to shower, bathe, or swim with the monitor on. They just ask you do not submerge deeper than 3 feet underwater. We recommend  removing the monitor if you are swimming in a lake, river, or ocean.  Your monitor battery will need to be switched to a fully charged monitor battery approximately once a week. The cell phone will alert you of an action which needs to be made.  On the cell phone, tap for details to reveal connection status, monitor battery status, and cell phone battery status. The green dots indicates your monitor is in good status. A red dot indicates there is something that needs your attention.  To record a symptom, click the circle on the monitor battery. In 30-60 seconds a list of symptoms will appear on the cell phone. Select your symptom and tap save. Your monitor will record a sustained or significant arrhythmia regardless of you clicking the button. Some patients do not feel the heart rhythm irregularities. [employer] will notify us of any serious or critical events.  Refer to instruction booklet for instructions on switching batteries, changing strips, the Do not disturb or Pause features, or any additional questions.  Call [employer] at [phone], to confirm your monitor is transmitting and record your baseline. They will answer any questions you may have regarding the monitor instructions at that time.  Returning the  monitor to [employer] Place all equipment back into blue box. Peel off strip of paper to expose adhesive and close box securely. There is a prepaid UPS shipping label on this box. Drop in a UPS drop box, or at a UPS facility like [employer]. You may also contact [employer] to arrange UPS to pick up monitor package at your home.

## 2021-05-01 NOTE — Assessment & Plan Note (Signed)
Lipids have been poorly controlled.  She is not very active because of a spinal cord injury.  She is interested in seeing what kind of exercises she can do without causing pain.  We will refer her to the PREP program at the [facility].  If lipids remain elevated in the future we will need to consider a statin.

## 2021-05-02 ENCOUNTER — Telehealth: Payer: Self-pay | Admitting: Cardiovascular Disease

## 2021-05-02 LAB — COMPREHENSIVE METABOLIC PANEL
ALT: 23 IU/L (ref 0–32)
AST: 27 IU/L (ref 0–40)
Albumin/Globulin Ratio: 1.4 (ref 1.2–2.2)
Albumin: 4.7 g/dL (ref 3.8–4.9)
Alkaline Phosphatase: 121 IU/L (ref 44–121)
BUN/Creatinine Ratio: 11 (ref 9–23)
BUN: 15 mg/dL (ref 6–24)
Bilirubin Total: <0.2 mg/dL (ref 0.0–1.2)
CO2: 25 mmol/L (ref 20–29)
Calcium: 9.8 mg/dL (ref 8.7–10.2)
Chloride: 101 mmol/L (ref 96–106)
Creatinine, Ser: 1.32 mg/dL — ABNORMAL HIGH (ref 0.57–1.00)
Globulin, Total: 3.3 g/dL (ref 1.5–4.5)
Glucose: 89 mg/dL (ref 65–99)
Potassium: 4.3 mmol/L (ref 3.5–5.2)
Sodium: 140 mmol/L (ref 134–144)
Total Protein: 8 g/dL (ref 6.0–8.5)
eGFR: 48 mL/min/{1.73_m2} — ABNORMAL LOW (ref >59)

## 2021-05-02 LAB — CBC WITH DIFFERENTIAL/PLATELET
Basophils Absolute: 0 10*3/uL (ref 0.0–0.2)
Basos: 1 %
EOS (ABSOLUTE): 0 10*3/uL (ref 0.0–0.4)
Eos: 1 %
Hematocrit: 36 % (ref 34.0–46.6)
Hemoglobin: 12 g/dL (ref 11.1–15.9)
Immature Grans (Abs): 0 10*3/uL (ref 0.0–0.1)
Immature Granulocytes: 0 %
Lymphocytes Absolute: 2.8 10*3/uL (ref 0.7–3.1)
Lymphs: 41 %
MCH: 30.2 pg (ref 26.6–33.0)
MCHC: 33.3 g/dL (ref 31.5–35.7)
MCV: 91 fL (ref 79–97)
Monocytes Absolute: 0.4 10*3/uL (ref 0.1–0.9)
Monocytes: 6 %
Neutrophils Absolute: 3.5 10*3/uL (ref 1.4–7.0)
Neutrophils: 51 %
Platelets: 321 10*3/uL (ref 150–450)
RBC: 3.98 x10E6/uL (ref 3.77–5.28)
RDW: 12.3 % (ref 11.7–15.4)
WBC: 6.8 10*3/uL (ref 3.4–10.8)

## 2021-05-02 LAB — MAGNESIUM: Magnesium: 2.3 mg/dL (ref 1.6–2.3)

## 2021-05-02 LAB — T4, FREE: Free T4: 0.99 ng/dL (ref 0.82–1.77)

## 2021-05-02 LAB — TSH: TSH: 1.67 u[IU]/mL (ref 0.450–4.500)

## 2021-05-02 NOTE — Telephone Encounter (Signed)
Patient states she was informed the heart monitor cannot be sent to a PO box and that is what she has listed. She would like to know if she needs to give the alternate address to [employer] or to the office. She states she will also need help putting the monitor on.

## 2021-05-02 NOTE — Telephone Encounter (Signed)
Informed patient I was informed [employer] will no ship monitors directly to a PO BOX.  If she needs assistance applying monitor, I will be at our [facility] location on Monday, 05/07/21.  Please call Orpha Bur in monitors to schedule appointment to have monitor applied ,

## 2021-05-03 ENCOUNTER — Encounter (HOSPITAL_BASED_OUTPATIENT_CLINIC_OR_DEPARTMENT_OTHER): Payer: Self-pay

## 2021-05-04 ENCOUNTER — Telehealth: Payer: Self-pay

## 2021-05-04 NOTE — Telephone Encounter (Signed)
Referral received for pt, left voicemail re: interest in attending PREP program. 

## 2021-05-07 ENCOUNTER — Ambulatory Visit: Payer: 59 | Admitting: Pulmonary Disease

## 2021-05-10 ENCOUNTER — Telehealth: Payer: Self-pay

## 2021-05-10 ENCOUNTER — Encounter (HOSPITAL_BASED_OUTPATIENT_CLINIC_OR_DEPARTMENT_OTHER): Payer: Self-pay

## 2021-05-10 NOTE — Telephone Encounter (Signed)
Received call back from patient re: PREP program; provided summary for the 12 week program. She thought it was an aquatics program to help get her moving in the water. Explained the types [facility], letting her know there was something for everyone. She shared that she is unable to use any of the machines discussed, and even walking short distances leaves her in excruciating pain from her extensive spinal cord issues. She is to follow up with neuro for further evaluation. Agreed plan is to hold off now, but she wants to be placed on the callback/waiting list for the next evening PREP class at [facility] pending results from tests and neuro eval.

## 2021-05-14 ENCOUNTER — Ambulatory Visit (INDEPENDENT_AMBULATORY_CARE_PROVIDER_SITE_OTHER): Payer: 59

## 2021-05-14 ENCOUNTER — Other Ambulatory Visit: Payer: Self-pay | Admitting: Physician Assistant

## 2021-05-14 ENCOUNTER — Other Ambulatory Visit: Payer: Self-pay

## 2021-05-14 DIAGNOSIS — R002 Palpitations: Secondary | ICD-10-CM | POA: Diagnosis not present

## 2021-06-08 ENCOUNTER — Ambulatory Visit
Admission: RE | Admit: 2021-06-08 | Discharge: 2021-06-08 | Disposition: A | Discharge status: Home / Self Care | Payer: 59 | Source: Ambulatory Visit | Attending: Internal Medicine | Admitting: Internal Medicine

## 2021-06-08 ENCOUNTER — Other Ambulatory Visit: Payer: Self-pay

## 2021-06-08 DIAGNOSIS — Z13820 Encounter for screening for osteoporosis: Secondary | ICD-10-CM

## 2021-06-08 DIAGNOSIS — Z1231 Encounter for screening mammogram for malignant neoplasm of breast: Secondary | ICD-10-CM

## 2021-06-11 ENCOUNTER — Telehealth: Payer: Self-pay

## 2021-06-11 NOTE — Telephone Encounter (Signed)
Left message requesting call back to discuss next evening PREP class

## 2021-06-14 ENCOUNTER — Ambulatory Visit: Payer: 59 | Admitting: Pulmonary Disease

## 2021-06-18 ENCOUNTER — Ambulatory Visit (HOSPITAL_BASED_OUTPATIENT_CLINIC_OR_DEPARTMENT_OTHER): Payer: 59 | Admitting: Family

## 2021-07-16 ENCOUNTER — Ambulatory Visit: Payer: 59 | Admitting: Pulmonary Disease

## 2021-07-20 ENCOUNTER — Ambulatory Visit (HOSPITAL_BASED_OUTPATIENT_CLINIC_OR_DEPARTMENT_OTHER): Payer: 59 | Admitting: Family

## 2021-08-02 ENCOUNTER — Encounter (HOSPITAL_BASED_OUTPATIENT_CLINIC_OR_DEPARTMENT_OTHER): Payer: Self-pay

## 2021-08-06 ENCOUNTER — Ambulatory Visit: Payer: 59 | Admitting: Podiatry

## 2021-08-09 ENCOUNTER — Encounter (HOSPITAL_BASED_OUTPATIENT_CLINIC_OR_DEPARTMENT_OTHER): Payer: Self-pay

## 2021-08-13 ENCOUNTER — Ambulatory Visit (HOSPITAL_BASED_OUTPATIENT_CLINIC_OR_DEPARTMENT_OTHER): Payer: 59 | Admitting: Family

## 2021-08-27 ENCOUNTER — Other Ambulatory Visit: Payer: Self-pay

## 2021-08-27 ENCOUNTER — Ambulatory Visit (INDEPENDENT_AMBULATORY_CARE_PROVIDER_SITE_OTHER): Payer: 59

## 2021-08-27 ENCOUNTER — Encounter: Payer: Self-pay | Admitting: Podiatry

## 2021-08-27 ENCOUNTER — Ambulatory Visit (INDEPENDENT_AMBULATORY_CARE_PROVIDER_SITE_OTHER): Payer: 59 | Admitting: Podiatry

## 2021-08-27 DIAGNOSIS — M205X1 Other deformities of toe(s) (acquired), right foot: Secondary | ICD-10-CM | POA: Diagnosis not present

## 2021-08-27 DIAGNOSIS — M779 Enthesopathy, unspecified: Secondary | ICD-10-CM

## 2021-08-27 NOTE — Progress Notes (Signed)
Ght

## 2021-08-29 ENCOUNTER — Encounter (HOSPITAL_BASED_OUTPATIENT_CLINIC_OR_DEPARTMENT_OTHER): Payer: Self-pay

## 2021-08-29 NOTE — Progress Notes (Signed)
Subjective:   Patient ID: [name] [name], female   DOB: 55 y.o.   MRN: [id number]   HPI Patient presents stating her big toe joint on her right has really been sore and it is making it hard for her to walk.  States that its been this way since the last time she just was not able to get in and it is gradually becoming more of an issue for her.  Patient states very tender with palpation   ROS      Objective:  Physical Exam  Vascular status intact significant limitation of joint movement first MPJ right with fluid buildup around the joint and pain with palpation with moderate swelling also occurring.  Patient has good digital perfusion     Assessment:  Hallux limitus condition right which is increasingly causing loss of motion and inflammation of the joint H&P     Plan:  We reviewed condition.  And I discussed the condition of this and I reviewed different treatment options due to the longstanding nature she is wants to go ahead with surgical intervention and I have recommended a biplanar type osteotomy to shorten the lower the first metatarsal with removal of any spurring and possible cartilage rehabilitation.  I allowed her to read consent form and reviewed alternative treatments complications patient wants surgery.  At this point after extensive review patient signed consent form all instructions given and patient scheduled for outpatient surgery.  I did dispense air fracture walker with all instructions on usage and patient will start using this prior to surgery to try to help take some of the stress off her joint.  Patient encouraged to call questions concerns which may arise prior to surgery understands no guarantee and could ultimately require joint implantation or fusion  X-rays indicate that there is some elevation of the first metatarsal segment with the beginnings of spur formation with inflammation of the joint surface with no elongation

## 2021-09-14 ENCOUNTER — Telehealth: Payer: Self-pay | Admitting: Neurology

## 2021-09-14 ENCOUNTER — Ambulatory Visit (HOSPITAL_BASED_OUTPATIENT_CLINIC_OR_DEPARTMENT_OTHER): Payer: 59 | Admitting: Family

## 2021-09-14 NOTE — Telephone Encounter (Signed)
Patient called and said since about a month ago when she has been having "severe pain" when walking and lifting her legs up. This pain has worsened over the last three days she explained.  She said Dr. Karel Jarvis sees her for multiple things including back pain.  She is wanting to see Dr. Manfred Shirts for this new problem.  Okay to schedule or add to wait list?

## 2021-09-16 NOTE — Telephone Encounter (Signed)
She sent same message on MyChart, replied on MyChart.

## 2021-09-17 ENCOUNTER — Telehealth: Payer: Self-pay | Admitting: *Deleted

## 2021-09-17 NOTE — Telephone Encounter (Signed)
Patient is calling because she has changed her surgery date,wants to know if she should  use her  air fracture walker until that date?Please advise.

## 2021-09-18 ENCOUNTER — Other Ambulatory Visit: Payer: Self-pay

## 2021-09-18 DIAGNOSIS — G8929 Other chronic pain: Secondary | ICD-10-CM

## 2021-09-18 DIAGNOSIS — M542 Cervicalgia: Secondary | ICD-10-CM

## 2021-09-18 DIAGNOSIS — M5412 Radiculopathy, cervical region: Secondary | ICD-10-CM

## 2021-09-19 NOTE — Telephone Encounter (Signed)
Ok to use?

## 2021-09-20 NOTE — Telephone Encounter (Signed)
Returned the call to patient to give recommendations for wearing the air fracture walker per physician,verbalized understanding.

## 2021-09-21 ENCOUNTER — Telehealth: Payer: Self-pay

## 2021-09-21 NOTE — Telephone Encounter (Signed)
Pt returned call reference PREP class.  Not interested in participating.

## 2021-09-24 ENCOUNTER — Telehealth: Payer: Self-pay | Admitting: Urology

## 2021-09-24 NOTE — Telephone Encounter (Signed)
DOS - 10/09/21  [place] BUNIONECTOMY  RIGHT --- [phone]   [facility] EFFECTIVE DATE - 11/18/20   PLAN DEDUCTIBLE - $500.00 W/ $0.00 REMAINING OUT OF POCKET - $3,000.00 W/ $1,671.52 REMAINING COINSURANCE - 20% COPAY - $0.00   PER [employer] WEBSITE FOR CPT CODE 74128 HAS BEEN APPROVED, AUTH # [id number], GOOD FROM 10/09/21 - 01/07/22

## 2021-09-28 DIAGNOSIS — G4733 Obstructive sleep apnea (adult) (pediatric): Secondary | ICD-10-CM

## 2021-09-29 NOTE — Telephone Encounter (Signed)
AO please advise. Thanks  

## 2021-10-01 ENCOUNTER — Ambulatory Visit: Payer: 59 | Admitting: Pulmonary Disease

## 2021-10-02 NOTE — Telephone Encounter (Signed)
Order has been placed for mask fitting and patient notified. Nothing further needed at this time.

## 2021-10-02 NOTE — Telephone Encounter (Signed)
Prescription to DME company for CPAP supplies  To try and help assess for a different mask/mask fitting

## 2021-10-08 MED ORDER — OXYCODONE-ACETAMINOPHEN 10-325 MG PO TABS
1.0000 | ORAL_TABLET | ORAL | 0 refills | Status: DC | PRN
Start: 2021-10-08 — End: 2021-12-03

## 2021-10-08 MED ORDER — ONDANSETRON HCL 4 MG PO TABS: 4.0000 mg | ORAL_TABLET | Freq: Three times a day (TID) | ORAL | 0 refills | Status: DC | PRN

## 2021-10-08 NOTE — Addendum Note (Signed)
Addended by: Lenn Sink on: 10/08/2021 04:48 PM   Modules accepted: Orders

## 2021-10-09 ENCOUNTER — Encounter: Payer: Self-pay | Admitting: Podiatry

## 2021-10-09 ENCOUNTER — Other Ambulatory Visit: Payer: Self-pay | Admitting: Neurology

## 2021-10-09 DIAGNOSIS — M2011 Hallux valgus (acquired), right foot: Secondary | ICD-10-CM

## 2021-10-09 DIAGNOSIS — M2021 Hallux rigidus, right foot: Secondary | ICD-10-CM | POA: Diagnosis not present

## 2021-10-12 ENCOUNTER — Other Ambulatory Visit: Payer: Self-pay | Admitting: Neurology

## 2021-10-15 ENCOUNTER — Ambulatory Visit (INDEPENDENT_AMBULATORY_CARE_PROVIDER_SITE_OTHER): Payer: 59 | Admitting: Podiatry

## 2021-10-15 ENCOUNTER — Encounter: Payer: Self-pay | Admitting: Podiatry

## 2021-10-15 ENCOUNTER — Other Ambulatory Visit: Payer: Self-pay

## 2021-10-15 ENCOUNTER — Ambulatory Visit (INDEPENDENT_AMBULATORY_CARE_PROVIDER_SITE_OTHER): Payer: 59

## 2021-10-15 DIAGNOSIS — Z9889 Other specified postprocedural states: Secondary | ICD-10-CM

## 2021-10-15 NOTE — Progress Notes (Signed)
Subjective:   Patient ID: [name] [name], female   DOB: 55 y.o.   MRN: [id number]   HPI Patient states doing well minimal discomfort no pain at this point and able to walk but I am still using crutches when I walk a lot neuro   ROS      Objective:  Physical Exam  Vascular status intact negative Denna Haggard' sign noted wound edges healing beautifully good range of motion no crepitus of the joint noted currently     Assessment:  Doing well post osteotomy first metatarsal right     Plan:  H&P reviewed condition recommended continued elevation compression and immobilization and patient will also work on range of motion will be seen back 3 weeks or earlier if needed  X-rays indicate osteotomies healing well good alignment noted check joint wide open

## 2021-10-19 ENCOUNTER — Encounter: Payer: Self-pay | Admitting: Podiatry

## 2021-10-19 ENCOUNTER — Encounter: Payer: Self-pay | Admitting: Neurology

## 2021-10-22 ENCOUNTER — Ambulatory Visit (HOSPITAL_BASED_OUTPATIENT_CLINIC_OR_DEPARTMENT_OTHER): Payer: 59 | Admitting: Cardiovascular Disease

## 2021-10-24 ENCOUNTER — Telehealth: Payer: Self-pay | Admitting: Neurology

## 2021-10-24 NOTE — Telephone Encounter (Signed)
Pt said she has been without medicine, having issues with [facility]. Just in case she has to go without her meds, what should she be looking for with withdrawals. Baclofen and gabapentin

## 2021-10-24 NOTE — Telephone Encounter (Signed)
[facility] is going to over night her medication to her and she will have it tonight.

## 2021-10-30 ENCOUNTER — Other Ambulatory Visit (HOSPITAL_BASED_OUTPATIENT_CLINIC_OR_DEPARTMENT_OTHER): Payer: 59 | Admitting: Internal Medicine

## 2021-10-31 ENCOUNTER — Telehealth: Payer: Self-pay | Admitting: *Deleted

## 2021-10-31 NOTE — Telephone Encounter (Signed)
Patient is calling because she is having sharp severe pain w/ sitting or standing, is keeping it elevated as much as possible but still pain w/ ,w/o the compression wrap on ( post surgery right foot). Please advise.

## 2021-11-02 ENCOUNTER — Other Ambulatory Visit: Payer: Self-pay | Admitting: Podiatry

## 2021-11-02 MED ORDER — TRAMADOL HCL 50 MG PO TABS
50.0000 mg | ORAL_TABLET | Freq: Four times a day (QID) | ORAL | 0 refills | Status: AC | PRN
Start: 2021-11-02 — End: 2021-11-07

## 2021-11-02 NOTE — Telephone Encounter (Signed)
Wrote her for tramadol. I would prefer she take no more then 2 per day

## 2021-11-02 NOTE — Telephone Encounter (Signed)
Take ibuprophen or alleve over the weekend and if not better come in on Monday

## 2021-11-02 NOTE — Telephone Encounter (Signed)
Returned the call to patient giving recommendations per Dr Charlsie Merles stated that she is unable to take ibuprofen or aleve but is taking tylenol , not helping, encouraged her to keep elevated staying off of it as much as possible.Can something else be sent to pharmacy for pain? Please advise.

## 2021-11-06 NOTE — Telephone Encounter (Signed)
Returned the call to patient giving information that the medication has been sent to pharmacy,said that she did not pick up because of the interaction with other medications  that she is taking but is better except for incision area that is still a little tender and some scaring. She will discuss at upcoming appointment.

## 2021-11-14 ENCOUNTER — Encounter: Payer: 59 | Admitting: Podiatry

## 2021-11-26 ENCOUNTER — Ambulatory Visit (INDEPENDENT_AMBULATORY_CARE_PROVIDER_SITE_OTHER): Payer: 59

## 2021-11-26 ENCOUNTER — Ambulatory Visit (INDEPENDENT_AMBULATORY_CARE_PROVIDER_SITE_OTHER): Payer: 59 | Admitting: Podiatry

## 2021-11-26 ENCOUNTER — Encounter: Payer: Self-pay | Admitting: Podiatry

## 2021-11-26 ENCOUNTER — Other Ambulatory Visit: Payer: Self-pay

## 2021-11-26 DIAGNOSIS — Z9889 Other specified postprocedural states: Secondary | ICD-10-CM | POA: Diagnosis not present

## 2021-11-26 DIAGNOSIS — M7752 Other enthesopathy of left foot: Secondary | ICD-10-CM

## 2021-11-26 DIAGNOSIS — M79672 Pain in left foot: Secondary | ICD-10-CM

## 2021-11-26 DIAGNOSIS — M779 Enthesopathy, unspecified: Secondary | ICD-10-CM

## 2021-11-26 MED ORDER — TRIAMCINOLONE ACETONIDE 10 MG/ML IJ SUSP
10.0000 mg | Freq: Once | INTRAMUSCULAR | Status: AC
  Administered 2021-11-26: 10 mg

## 2021-11-26 NOTE — Progress Notes (Signed)
Subjective:   Patient ID: [name] [name], female   DOB: 56 y.o.   MRN: [id number]   HPI Patient presents stating that my right foot still is sore but doing well and I developed a lot of pain in my left ankle and I do have some swelling in the midfoot right and I do have a long-term history of kidney disease   ROS      Objective:  Physical Exam  Neurovascular status intact negative Denna Haggard' sign was noted with patient found to have improvement around the first MPJ right mild discomfort when I move the toe too much but overall healing very well from surgery with +1 pitting edema right midfoot but localized no swelling of the ankle or into the calf with the left ankle being very tender in the sinus tarsi     Assessment:  Inflammation of the right foot that is healing well but may be related to the kidney just created some generalized swelling with inflammatory sinus tarsitis left     Plan:  H&P x-rays of both feet reviewed and for the right I recommended continued compression elevation and for the left I did do sinus tarsi injection 3 mg Kenalog 5 mg Xylocaine advised on anti-inflammatories as needed but I do not want her taking anything strong because of her kidneys and I applied complex pressure into the right foot with ankle compression stocking.  I want to see back 6 weeks this all should heal uneventfully but will be seen back  X-rays indicate the osteotomy is healing beautiful fixation in place with good alignment no movement and the left shows that there is no signs of arthritis or stress fracture of the subtalar joint

## 2021-12-03 ENCOUNTER — Encounter: Payer: Self-pay | Admitting: Neurology

## 2021-12-03 ENCOUNTER — Other Ambulatory Visit: Payer: Self-pay

## 2021-12-03 ENCOUNTER — Telehealth (INDEPENDENT_AMBULATORY_CARE_PROVIDER_SITE_OTHER): Payer: 59 | Admitting: Neurology

## 2021-12-03 ENCOUNTER — Other Ambulatory Visit: Payer: Self-pay | Admitting: Podiatry

## 2021-12-03 VITALS — Ht 67.0 in | Wt 188.0 lb

## 2021-12-03 DIAGNOSIS — M542 Cervicalgia: Secondary | ICD-10-CM | POA: Diagnosis not present

## 2021-12-03 DIAGNOSIS — G43009 Migraine without aura, not intractable, without status migrainosus: Secondary | ICD-10-CM

## 2021-12-03 DIAGNOSIS — M549 Dorsalgia, unspecified: Secondary | ICD-10-CM

## 2021-12-03 DIAGNOSIS — M779 Enthesopathy, unspecified: Secondary | ICD-10-CM

## 2021-12-03 DIAGNOSIS — G8929 Other chronic pain: Secondary | ICD-10-CM

## 2021-12-03 DIAGNOSIS — G40309 Generalized idiopathic epilepsy and epileptic syndromes, not intractable, without status epilepticus: Secondary | ICD-10-CM | POA: Diagnosis not present

## 2021-12-03 NOTE — Progress Notes (Signed)
Pt called to go over chart no answer no voice mail picked up
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
## 2021-12-04 ENCOUNTER — Encounter: Payer: Self-pay | Admitting: Neurology

## 2021-12-04 MED ORDER — GABAPENTIN 300 MG PO CAPS: ORAL_CAPSULE | ORAL | 3 refills | Status: DC

## 2021-12-04 MED ORDER — BACLOFEN 20 MG PO TABS: 20.0000 mg | ORAL_TABLET | Freq: Three times a day (TID) | ORAL | 3 refills | Status: DC

## 2021-12-04 MED ORDER — LAMOTRIGINE 200 MG PO TABS: 200.0000 mg | ORAL_TABLET | Freq: Two times a day (BID) | ORAL | 3 refills | Status: DC
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
## 2021-12-13 ENCOUNTER — Encounter: Payer: Self-pay | Admitting: Pulmonary Disease

## 2021-12-31 ENCOUNTER — Encounter: Payer: Self-pay | Admitting: Neurology

## 2022-01-02 ENCOUNTER — Other Ambulatory Visit (HOSPITAL_BASED_OUTPATIENT_CLINIC_OR_DEPARTMENT_OTHER): Payer: 59 | Admitting: Internal Medicine

## 2022-01-07 ENCOUNTER — Ambulatory Visit (INDEPENDENT_AMBULATORY_CARE_PROVIDER_SITE_OTHER): Payer: 59 | Admitting: Podiatry

## 2022-01-07 ENCOUNTER — Ambulatory Visit (INDEPENDENT_AMBULATORY_CARE_PROVIDER_SITE_OTHER): Payer: 59

## 2022-01-07 ENCOUNTER — Encounter: Payer: Self-pay | Admitting: Podiatry

## 2022-01-07 ENCOUNTER — Other Ambulatory Visit: Payer: Self-pay

## 2022-01-07 DIAGNOSIS — Z9889 Other specified postprocedural states: Secondary | ICD-10-CM | POA: Diagnosis not present

## 2022-01-07 NOTE — Progress Notes (Signed)
Subjective:   Patient ID: [name] [name], female   DOB: 57 y.o.   MRN: [id number]   HPI Patient presents stating doing well with surgery right with mild discomfort only if she is too active and states the left ankle is improving   ROS      Objective:  Physical Exam  Neurovascular status intact with patient's right first MPJ healing well wound edges well coapted good alignment and good range of motion no crepitus in left ankle feeling better     Assessment:  Doing better after having surgery right foot improved left ankle     Plan:  H&P reviewed conditions and for the right I went ahead and I recommended return to normal activity and range of motion exercises.  Reappoint to recheck  X-rays indicate osteotomies healing well fixation in place joint congruence

## 2022-01-14 ENCOUNTER — Other Ambulatory Visit: Payer: Self-pay

## 2022-01-14 ENCOUNTER — Ambulatory Visit (INDEPENDENT_AMBULATORY_CARE_PROVIDER_SITE_OTHER): Payer: Medicare Other | Admitting: Cardiovascular Disease

## 2022-01-14 ENCOUNTER — Encounter (HOSPITAL_BASED_OUTPATIENT_CLINIC_OR_DEPARTMENT_OTHER): Payer: Self-pay | Admitting: Cardiovascular Disease

## 2022-01-14 DIAGNOSIS — I493 Ventricular premature depolarization: Secondary | ICD-10-CM

## 2022-01-14 DIAGNOSIS — I10 Essential (primary) hypertension: Secondary | ICD-10-CM | POA: Diagnosis not present

## 2022-01-14 DIAGNOSIS — I700 Atherosclerosis of aorta: Secondary | ICD-10-CM | POA: Diagnosis not present

## 2022-01-14 NOTE — Patient Instructions (Signed)
Medication Instructions:  Your physician recommends that you continue on your current medications as directed. Please refer to the Current Medication list given to you today.   *If you need a refill on your cardiac medications before your next appointment, please call your pharmacy*  Lab Work: NONE  Testing/Procedures: NONE  Follow-Up: At [facility], you and your health needs are our priority.  As part of our continuing mission to provide you with exceptional heart care, we have created designated Provider Care Teams.  These Care Teams include your primary Cardiologist (physician) and Advanced Practice Providers (APPs -  Physician Assistants and Nurse Practitioners) who all work together to provide you with the care you need, when you need it.  We recommend signing up for the patient portal called "MyChart".  Sign up information is provided on this After Visit Summary.  MyChart is used to connect with patients for Virtual Visits (Telemedicine).  Patients are able to view lab/test results, encounter notes, upcoming appointments, etc.  Non-urgent messages can be sent to your provider as well.   To learn more about what you can do with MyChart, go to [email or web address].    Your next appointment:   12 month(s)  The format for your next appointment:   In Person  Provider:   Chilton Si, MD   Your physician recommends that you schedule a follow-up appointment in: 3 MONTHS WITH CAITLIN W  NP OR JESSE C NP   Other Instructions  MONITOR YOUR BLOOD PRESSURE AT HOME, BRING YOUR READING AND MACHINE TO FOLLOW UP IN 3 ONTHS

## 2022-01-14 NOTE — Assessment & Plan Note (Signed)
Noted on imaging.  Lipids have not been checked in a while.  She reports that she has an upcoming visit with her PCP 1 have a checked at that time.  Her LDL should be less than 70.  Continue aspirin.

## 2022-01-14 NOTE — Assessment & Plan Note (Signed)
Symptoms have resolved.  Continue diltiazem.  Encouraged her to increase her exercise as tolerated.

## 2022-01-14 NOTE — Assessment & Plan Note (Signed)
Blood pressure was elevated in the office but she thinks it has been much better controlled at home.  She is going to track her blood pressures at home and bring to follow-up.  She will also bring her machine to verify that it is accurate.

## 2022-01-14 NOTE — Progress Notes (Signed)
Cardiology Office Note:    Date:  01/14/2022   ID:  [name] [name], DOB 11-Nov-1966, MRN [id number]  PCP:  Karleen Hampshire., MD   [facility] Providers Cardiologist:  Ena Dawley, MD     Referring MD: Karleen Hampshire., MD   No chief complaint on file.   History of Present Illness:    [name] [name] is a 56 y.o. female with a hx of aortic atherosclerosis, asthma, CKD stage II, complex partial seizures, hyperlipidemia, hypertension, PAC's/PVC's, and obstructive sleep apnea, who presents for follow-up. She was last seen by Dr. Meda Coffee on 01/23/2018 with cc of chest pain and exertional dyspnea. Most recently she was seen by Levell July, Brooke Bonito., NP on 12/24/2019 for follow-up of hypertension management. At that visit her blood pressure was improving and she was advised to continue taking hydralazine 25 mg bid, and continue Lasix 20 mg daily. She was previously on diltiazem for PAC's and PVC's.   At her last visit she reported palpitations.  She wore monitor 04/2021 that showed no arrhythmias.  She reported episodes of sudden lightheadedness that cause her to stop and hold something for balance. She attributes these episodes to a previous spinal cord injury, and this is followed by her neurologist. Due to her spinal cord injury, she is severely limited in terms of exercise.  She was referred to the PREP program at the [facility].  She has not been able to get much exercise.  She tries to walk around her house for 30 minutes twice a day.  She has no exertional chest pain or shortness of breath.  Her only limitation is from her back and legs.  She has chronic pain from her spine.  She also gets migraines.  At home her blood pressure has been well controlled.  She reports that it has been between 1 16-1 30 over 60s to 26s.  She has not had any recent palpitations.  Past Medical History:  Diagnosis Date   Aortic atherosclerosis ([place])    Aortic atherosclerosis ([place]) 05/01/2021   Chronic headaches     CKD (chronic kidney disease), stage II    Complex partial seizures (HCC)    Dyspnea 12/24/2006   B/P MV - normal perfusion in all regions; post stress LV normal in size; EF 63%; no significant ischemia noted   Hyperlipidemia    Hypertension    Palpitations 01/01/2007   30 day event monitor - 69 pt reported events- some PAC and PVCs reported   PVC's (premature ventricular contractions)    Sleep apnea    SOB (shortness of breath) 04/20/2012   MET test - exercise limiting factor - chronotropic incompetence (medication), peak VO2 76% of predicted; no EKG ST changes noted at sub-optimal peak HR    Past Surgical History:  Procedure Laterality Date   ABDOMINAL HYSTERECTOMY  02/2003   ACD  04/24/2010   Done at [facility] --C2-3 and C6 and C7   ACDF N/A 05/01/2007   Done at [facility] --C5 to [place]  05/2004   CERVICAL DISC SURGERY  12/01/2002   C4-C5 fusion and bone graft   FOOT SURGERY      Current Medications: Current Meds  Medication Sig   acetaminophen (TYLENOL) 500 MG tablet Take 1,000 mg by mouth every 6 (six) hours as needed for pain (pain).   aspirin 81 MG tablet Take 81 mg by mouth daily.   baclofen (LIORESAL) 20 MG tablet Take 1 tablet (20 mg total) by mouth  3 (three) times daily.   diltiazem (CARDIZEM) 120 MG tablet Take 240 mg by mouth 2 (two) times daily.   furosemide (LASIX) 20 MG tablet Take 20 mg by mouth daily.   gabapentin (NEURONTIN) 300 MG capsule TAKE 1 CAPSULE BY MOUTH IN  THE MORNING AND 2 TO 3  CAPSULES BY MOUTH IN THE  EVENING   hydrALAZINE (APRESOLINE) 25 MG tablet TAKE 1 TABLET BY MOUTH  TWICE DAILY   lamoTRIgine (LAMICTAL) 200 MG tablet Take 1 tablet (200 mg total) by mouth 2 (two) times daily.   Multiple Vitamins-Minerals (MULTIVITAMIN-MINERALS PO) Take by mouth.   NON FORMULARY Take 1,000 mg by mouth daily. [facility] Vitamin D   omeprazole (PRILOSEC OTC) 20 MG tablet Take 20 mg by mouth daily.   ondansetron (ZOFRAN) 4 MG tablet Take 1  tablet (4 mg total) by mouth every 8 (eight) hours as needed for nausea or vomiting.     Allergies:   Camphor-menthol, Fish allergy, Fish-derived products, Sarna sensitive, Wheat bran, Adhesive [tape], Sulfa antibiotics, Sulfacetamide sodium, Sulfasalazine, Halog [halcinonide], Other, Ketoconazole, and Triamcinolone   Social History   Socioeconomic History   Marital status: Married    Spouse name: Not on file   Number of children: Not on file   Years of education: Not on file   Highest education level: Not on file  Occupational History   Not on file  Tobacco Use   Smoking status: Never   Smokeless tobacco: Never  Vaping Use   Vaping Use: Never used  Substance and Sexual Activity   Alcohol use: No   Drug use: No   Sexual activity: Not on file  Other Topics Concern   Not on file  Social History Narrative   Pt is R handed   Lives in 2 story home with her husband, Girard Cooter.   Has 1 biological child / 1 step child   Associated degree in nursing   disabled   Social Determinants of Health   Financial Resource Strain: Not on file  Food Insecurity: Not on file  Transportation Needs: Not on file  Physical Activity: Not on file  Stress: Not on file  Social Connections: Not on file     Family History: The patient's family history includes Asthma in her brother; Breast cancer (age of onset: 9) in her mother; Diabetes in her mother; Healthy in her brother; Hypertension in her mother.  ROS:   Please see the history of present illness.    (+) Palpitations (+) LE edema (+) Lightheadedness (+) Imbalance All other systems reviewed and are negative.  EKGs/Labs/Other Studies Reviewed:    The following studies were reviewed today:  Echo 11/04/2019:  1. Left ventricular ejection fraction, by visual estimation, is 65 to  70%. The left ventricle has normal function. There is borderline left  ventricular hypertrophy.   2. Left ventricular diastolic parameters are consistent with  Grade I  diastolic dysfunction (impaired relaxation).   3. The left ventricle has no regional wall motion abnormalities.   4. Global right ventricle has normal systolic function.The right  ventricular size is normal. No increase in right ventricular wall  thickness.   5. Left atrial size was normal.   6. Right atrial size was normal.   7. The mitral valve is normal in structure. Mild mitral valve  regurgitation. No evidence of mitral stenosis.   8. The tricuspid valve is normal in structure. Tricuspid valve  regurgitation is mild.   9. The aortic valve is normal in structure.  Aortic valve regurgitation is  not visualized. No evidence of aortic valve sclerosis or stenosis.  10. The pulmonic valve was normal in structure. Pulmonic valve  regurgitation is not visualized.  11. Mildly elevated pulmonary artery systolic pressure.  12. The tricuspid regurgitant velocity is 2.61 m/s, and with an assumed  right atrial pressure of 3 mmHg, the estimated right ventricular systolic  pressure is mildly elevated at 30.2 mmHg.  13. The inferior vena cava is normal in size with greater than 50%  respiratory variability, suggesting right atrial pressure of 3 mmHg.   Holter Monitor 02/09/2018: Sinus bradycardia to sinus rhythm. No PVCs, PACs no runs. Completely normal 48 hour Holter monitor. No arrhythmias or pauses.  Echo 09/16/2016: - Left ventricle: The cavity size was normal. Systolic function was    normal. The estimated ejection fraction was in the range of 60%    to 65%. Wall motion was normal; there were no regional wall    motion abnormalities. Left ventricular diastolic function    parameters were normal.  - Aortic valve: Trileaflet; normal thickness leaflets. There was no    regurgitation.  - Aortic root: The aortic root was normal in size.  - Mitral valve: Structurally normal valve. There was no    regurgitation.  - Left atrium: The atrium was normal in size.  - Right ventricle:  Systolic function was normal.  - Right atrium: The atrium was normal in size.  - Tricuspid valve: There was mild regurgitation.  - Pulmonic valve: There was no regurgitation.  - Pulmonary arteries: Systolic pressure was within the normal    range.  - Inferior vena cava: The vessel was normal in size.  - Pericardium, extracardiac: There was no pericardial effusion.   ETT 08/31/2015: There was no ST segment deviation noted during stress. Fair exercise capacity. Exercise limited by back pain (hx of 3 surgeries) No chest pain. She did complain of dizziness after exercise was ended. Normal BP response to exercise. No ST changes to suggest ischemia at submaximal exercise. No exercise induced arrhythmias.  EKG:    05/01/2021: Sinus Rhythm. Rate 73 bpm.  3 Day Event Monitor 6/20222:   Quality: Fair.  Baseline artifact. Predominant rhythm: Sinus rhythm Average heart rate: 67 bpm Max heart rate: 95 bpm Min heart rate: 50 bpm Pauses >2.5 seconds: None   No arrhythmias Patient reported shortness of breath and palpitations but no arrhythmias were noted.    Recent Labs: 05/01/2021: ALT 23; BUN 15; Creatinine, Ser 1.32; Hemoglobin 12.0; Magnesium 2.3; Platelets 321; Potassium 4.3; Sodium 140; TSH 1.670  Recent Lipid Panel No results found for: CHOL, TRIG, HDL, CHOLHDL, VLDL, LDLCALC, LDLDIRECT   Physical Exam:    VS:  BP (!) 144/86 (BP Location: Left Arm, Patient Position: Sitting, Cuff Size: Large)    Pulse 71    Ht '5\' 7"'  (1.702 m)    Wt 193 lb 11.2 oz (87.9 kg)    BMI 30.34 kg/m  , BMI Body mass index is 30.34 kg/m. GENERAL:  Well appearing HEENT: Pupils equal round and reactive, fundi not visualized, oral mucosa unremarkable NECK:  No jugular venous distention, waveform within normal limits, carotid upstroke brisk and symmetric, no bruits, no thyromegaly LUNGS:  Clear to auscultation bilaterally HEART:  RRR.  PMI not displaced or sustained,S1 and S2 within normal limits, no S3, no  S4, no clicks, no rubs, no murmurs ABD:  Flat, positive bowel sounds normal in frequency in pitch, no bruits, no rebound, no guarding, no midline  pulsatile mass, no hepatomegaly, no splenomegaly EXT:  2 plus pulses throughout, no edema, no cyanosis no clubbing SKIN:  No rashes no nodules NEURO:  Cranial nerves II through XII grossly intact, motor grossly intact throughout PSYCH:  Cognitively intact, oriented to person place and time   ASSESSMENT:    1. Essential hypertension   2. PVC's (premature ventricular contractions)   3. Aortic atherosclerosis (HCC)     PLAN:   Essential hypertension Blood pressure was elevated in the office but she thinks it has been much better controlled at home.  She is going to track her blood pressures at home and bring to follow-up.  She will also bring her machine to verify that it is accurate.  PVC's (premature ventricular contractions) Symptoms have resolved.  Continue diltiazem.  Encouraged her to increase her exercise as tolerated.  Aortic atherosclerosis ([place]) Noted on imaging.  Lipids have not been checked in a while.  She reports that she has an upcoming visit with her PCP 1 have a checked at that time.  Her LDL should be less than 70.  Continue aspirin.    Disposition: FU with APP in 3 months. FU with Solstice Lastinger C. Oval Linsey, MD, [facility] in 1 year   Medication Adjustments/Labs and Tests Ordered: Current medicines are reviewed at length with the patient today.  Concerns regarding medicines are outlined above.  Orders Placed This Encounter  Procedures   EKG 12-Lead    No orders of the defined types were placed in this encounter.    Patient Instructions  Medication Instructions:  Your physician recommends that you continue on your current medications as directed. Please refer to the Current Medication list given to you today.   *If you need a refill on your cardiac medications before your next appointment, please call your pharmacy*  Lab  Work: NONE  Testing/Procedures: NONE  Follow-Up: At [facility], you and your health needs are our priority.  As part of our continuing mission to provide you with exceptional heart care, we have created designated Provider Care Teams.  These Care Teams include your primary Cardiologist (physician) and Advanced Practice Providers (APPs -  Physician Assistants and Nurse Practitioners) who all work together to provide you with the care you need, when you need it.  We recommend signing up for the patient portal called "MyChart".  Sign up information is provided on this After Visit Summary.  MyChart is used to connect with patients for Virtual Visits (Telemedicine).  Patients are able to view lab/test results, encounter notes, upcoming appointments, etc.  Non-urgent messages can be sent to your provider as well.   To learn more about what you can do with MyChart, go to [email or web address].    Your next appointment:   12 month(s)  The format for your next appointment:   In Person  Provider:   Skeet Latch, MD   Your physician recommends that you schedule a follow-up appointment in: [place] W  NP OR JESSE C NP   Other Instructions  MONITOR YOUR BLOOD PRESSURE AT HOME, [place] TO FOLLOW UP IN 3 ONTHS      Signed, Skeet Latch, MD  01/14/2022 4:40 PM    [facility]

## 2022-02-06 ENCOUNTER — Encounter (HOSPITAL_BASED_OUTPATIENT_CLINIC_OR_DEPARTMENT_OTHER): Payer: Self-pay | Admitting: Pulmonary Disease

## 2022-02-11 ENCOUNTER — Other Ambulatory Visit (HOSPITAL_BASED_OUTPATIENT_CLINIC_OR_DEPARTMENT_OTHER): Payer: 59 | Admitting: Pulmonary Disease

## 2022-02-18 ENCOUNTER — Ambulatory Visit (HOSPITAL_BASED_OUTPATIENT_CLINIC_OR_DEPARTMENT_OTHER): Payer: 59 | Attending: Pulmonary Disease | Admitting: Pulmonary Disease

## 2022-02-18 DIAGNOSIS — G4733 Obstructive sleep apnea (adult) (pediatric): Secondary | ICD-10-CM

## 2022-04-16 ENCOUNTER — Ambulatory Visit (HOSPITAL_BASED_OUTPATIENT_CLINIC_OR_DEPARTMENT_OTHER): Payer: 59 | Admitting: Family

## 2022-04-19 ENCOUNTER — Other Ambulatory Visit: Payer: Self-pay | Admitting: Internal Medicine

## 2022-04-19 DIAGNOSIS — Z1231 Encounter for screening mammogram for malignant neoplasm of breast: Secondary | ICD-10-CM

## 2022-04-29 ENCOUNTER — Ambulatory Visit (INDEPENDENT_AMBULATORY_CARE_PROVIDER_SITE_OTHER): Payer: 59 | Admitting: Family

## 2022-04-29 ENCOUNTER — Encounter (HOSPITAL_BASED_OUTPATIENT_CLINIC_OR_DEPARTMENT_OTHER): Payer: Self-pay | Admitting: Family

## 2022-04-29 ENCOUNTER — Encounter (HOSPITAL_BASED_OUTPATIENT_CLINIC_OR_DEPARTMENT_OTHER): Payer: Self-pay

## 2022-04-29 VITALS — BP 144/78 | HR 74 | Ht 67.0 in | Wt 193.0 lb

## 2022-04-29 DIAGNOSIS — I10 Essential (primary) hypertension: Secondary | ICD-10-CM | POA: Diagnosis not present

## 2022-04-29 DIAGNOSIS — I700 Atherosclerosis of aorta: Secondary | ICD-10-CM | POA: Diagnosis not present

## 2022-04-29 DIAGNOSIS — I493 Ventricular premature depolarization: Secondary | ICD-10-CM

## 2022-04-29 DIAGNOSIS — R002 Palpitations: Secondary | ICD-10-CM

## 2022-04-29 NOTE — Progress Notes (Addendum)
Office Visit    Patient Name: [name] [name] Date of Encounter: 04/29/2022  PCP:  Karleen Hampshire., MD   [facility]  Cardiologist:  Skeet Latch, MD  Advanced Practice Provider:  No care team member to display Electrophysiologist:  None      Chief Complaint    [name] [name] is a 56 y.o. female with a hx of aortic atherosclerosis, asthma, CKD 2, complex partial seizure, hyperlipidemia, hypertension, PAC/PVC, OSA presents today for hypertension follow-up  Past Medical History    Past Medical History:  Diagnosis Date   Aortic atherosclerosis ([place])    Aortic atherosclerosis ([place]) 05/01/2021   Chronic headaches    CKD (chronic kidney disease), stage II    Complex partial seizures ([place])    Dyspnea 12/24/2006   B/P MV - normal perfusion in all regions; post stress LV normal in size; EF 63%; no significant ischemia noted   Hyperlipidemia    Hypertension    Palpitations 01/01/2007   30 day event monitor - 69 pt reported events- some PAC and PVCs reported   PVC's (premature ventricular contractions)    Sleep apnea    SOB (shortness of breath) 04/20/2012   MET test - exercise limiting factor - chronotropic incompetence (medication), peak VO2 76% of predicted; no EKG ST changes noted at sub-optimal peak HR   Past Surgical History:  Procedure Laterality Date   ABDOMINAL HYSTERECTOMY  02/2003   ACD  04/24/2010   Done at [facility] --C2-3 and C6 and C7   ACDF N/A 05/01/2007   Done at [facility] --C5 to [place]  05/2004   CERVICAL DISC SURGERY  12/01/2002   C4-C5 fusion and bone graft   FOOT SURGERY      Allergies  Allergies  Allergen Reactions   Camphor-Menthol Anaphylaxis   Fish Allergy Anaphylaxis    Respiratory Distress   Fish-Derived Products Anaphylaxis   Sarna Sensitive Anaphylaxis    Respiratory distress with the Sarna lotion   Wheat Bran Anaphylaxis   Adhesive [Tape] Other (See Comments)    Tape,  Electrode, and Band aid Adhesive Leaves "burn Marks"      Sulfa Antibiotics Other (See Comments)     "General malaze"   Sulfacetamide Sodium      "General malaise   Sulfasalazine      "General malaze"   Halog [Halcinonide]     Cough per patient    Other Other (See Comments)    EKG LEADS OR ADHESIVE CAUSES BURNS OK TO USE CHILD EKG LEADS   Ketoconazole Cough     Nose/eyes watery   Triamcinolone Cough     Watery eyes/nose    History of Present Illness    [name] [name] is a 56 y.o. female with a hx of aortic atherosclerosis, asthma, CKD 2, complex partial seizure, hyperlipidemia, hypertension, PVC/PAC, OSA last seen 01/14/2022 by Dr. Oval Linsey.  Prior patient of Dr. Meda Coffee.  She has since established with Dr. Oval Linsey.  Wore a monitor 04/2021 with no arrhythmias.  She noted sudden episodes of lightheadedness which she attributed to previous spinal cord injury and follows with neurology.  At last clinic visit 01/14/2022 she was noted to have elevated blood pressure in clinic and was recommended to monitor at home and for sooner follow-up.  She was additionally recommended for cholesterol labs due to aortic atherosclerosis and LDL goal less than 70 but she preferred to wait for her primary care appointment.  She presents today  for follow up with her husband.  Notes since last seen she had an MRI showing for bulging disks in her lumbar area and 1 in her neck.  She was offered Lumbar Medial Branch Block but was concerned about the procedure and elected not to proceed at this time.  She monitors her blood pressure routinely at home and brings detailed log showing blood pressure most often 110s-120s/60s with heart rate 60s. On a log of >50 blood pressure readings it was only >130/80 five times. Reports no shortness of breath nor dyspnea on exertion. Reports no chest pain, pressure, or tightness. No edema, orthopnea, PND. Reports no palpitations.    EKGs/Labs/Other Studies Reviewed:    The following studies were reviewed today:  Echo 11/04/2019:  1. Left ventricular ejection fraction, by visual estimation, is 65 to  70%. The left ventricle has normal function. There is borderline left  ventricular hypertrophy.   2. Left ventricular diastolic parameters are consistent with Grade I  diastolic dysfunction (impaired relaxation).   3. The left ventricle has no regional wall motion abnormalities.   4. Global right ventricle has normal systolic function.The right  ventricular size is normal. No increase in right ventricular wall  thickness.   5. Left atrial size was normal.   6. Right atrial size was normal.   7. The mitral valve is normal in structure. Mild mitral valve  regurgitation. No evidence of mitral stenosis.   8. The tricuspid valve is normal in structure. Tricuspid valve  regurgitation is mild.   9. The aortic valve is normal in structure. Aortic valve regurgitation is  not visualized. No evidence of aortic valve sclerosis or stenosis.  10. The pulmonic valve was normal in structure. Pulmonic valve  regurgitation is not visualized.  11. Mildly elevated pulmonary artery systolic pressure.  12. The tricuspid regurgitant velocity is 2.61 m/s, and with an assumed  right atrial pressure of 3 mmHg, the estimated right ventricular systolic  pressure is mildly elevated at 30.2 mmHg.  13. The inferior vena cava is normal in size with greater than 50%  respiratory variability, suggesting right atrial pressure of 3 mmHg.    Holter Monitor 02/09/2018: Sinus bradycardia to sinus rhythm. No PVCs, PACs no runs. Completely normal 48 hour Holter monitor. No arrhythmias or pauses.   Echo 09/16/2016: - Left ventricle: The cavity size was normal. Systolic function was    normal. The estimated ejection fraction was in the range of 60%    to 65%. Wall motion was normal; there were no regional wall    motion abnormalities. Left ventricular diastolic function    parameters  were normal.  - Aortic valve: Trileaflet; normal thickness leaflets. There was no    regurgitation.  - Aortic root: The aortic root was normal in size.  - Mitral valve: Structurally normal valve. There was no    regurgitation.  - Left atrium: The atrium was normal in size.  - Right ventricle: Systolic function was normal.  - Right atrium: The atrium was normal in size.  - Tricuspid valve: There was mild regurgitation.  - Pulmonic valve: There was no regurgitation.  - Pulmonary arteries: Systolic pressure was within the normal    range.  - Inferior vena cava: The vessel was normal in size.  - Pericardium, extracardiac: There was no pericardial effusion.    ETT 08/31/2015: There was no ST segment deviation noted during stress. Fair exercise capacity. Exercise limited by back pain (hx of 3 surgeries) No chest pain. She did  complain of dizziness after exercise was ended. Normal BP response to exercise. No ST changes to suggest ischemia at submaximal exercise. No exercise induced arrhythmias.  EKG: No EKG today  Recent Labs: 05/01/2021: ALT 23; BUN 15; Creatinine, Ser 1.32; Hemoglobin 12.0; Magnesium 2.3; Platelets 321; Potassium 4.3; Sodium 140; TSH 1.670  Recent Lipid Panel No results found for: "CHOL", "TRIG", "HDL", "CHOLHDL", "VLDL", "LDLCALC", "LDLDIRECT"   Home Medications   Current Meds  Medication Sig   acetaminophen (TYLENOL) 500 MG tablet Take 1,000 mg by mouth every 6 (six) hours as needed for pain (pain).   aspirin 81 MG tablet Take 81 mg by mouth daily.   Bacillus Coagulans-Inulin (ALIGN PREBIOTIC-PROBIOTIC PO) Take 1 capsule by mouth daily.   baclofen (LIORESAL) 20 MG tablet Take 1 tablet (20 mg total) by mouth 3 (three) times daily.   diltiazem (CARDIZEM) 120 MG tablet Take 240 mg by mouth 2 (two) times daily.   furosemide (LASIX) 20 MG tablet Take 20 mg by mouth as needed for fluid or edema.   gabapentin (NEURONTIN) 300 MG capsule TAKE 1 CAPSULE BY MOUTH IN  THE  MORNING AND 2 TO 3  CAPSULES BY MOUTH IN THE  EVENING   hydrALAZINE (APRESOLINE) 25 MG tablet TAKE 1 TABLET BY MOUTH  TWICE DAILY   lamoTRIgine (LAMICTAL) 200 MG tablet Take 1 tablet (200 mg total) by mouth 2 (two) times daily.   Multiple Vitamins-Minerals (MULTIVITAMIN-MINERALS PO) Take by mouth.   NON FORMULARY Take 1,000 mg by mouth daily. [facility] Vitamin D   omeprazole (PRILOSEC OTC) 20 MG tablet Take 20 mg by mouth daily.   ondansetron (ZOFRAN) 4 MG tablet Take 1 tablet (4 mg total) by mouth every 8 (eight) hours as needed for nausea or vomiting.   [DISCONTINUED] furosemide (LASIX) 20 MG tablet Take 20 mg by mouth daily.     Review of Systems      All other systems reviewed and are otherwise negative except as noted above.  Physical Exam    VS:  BP (!) 144/78   Pulse 74   Ht _0  (1.702 m)   Wt 193 lb (87.5 kg)   BMI 30.23 kg/m  , BMI Body mass index is 30.23 kg/m.  Wt Readings from Last 3 Encounters:  04/29/22 193 lb (87.5 kg)  02/18/22 193 lb (87.5 kg)  01/14/22 193 lb 11.2 oz (87.9 kg)    GEN: Well nourished, well developed, in no acute distress. HEENT: normal. Neck: Supple, no JVD, carotid bruits, or masses. Cardiac: RRR, no murmurs, rubs, or gallops. No clubbing, cyanosis, edema.  Radials/PT 2+ and equal bilaterally.  Respiratory:  Respirations regular and unlabored, clear to auscultation bilaterally. GI: Soft, nontender, nondistended. MS: No deformity or atrophy. Skin: Warm and dry, no rash. Neuro:  Strength and sensation are intact. Psych: Normal affect.  Assessment & Plan    Hypertension -BP mildly elevated in clinic though home readings 110s-12s/60s.  Continue hydralazine 25 mg twice daily.  She will contact our office if BP routinely greater than 130/80. Discussed to monitor BP at home at least 2 hours after medications and sitting for 5-10 minutes.   Palpitations /PVC /PAC -overall not bothersome on current dose diltiazem.  Encouraged to stay well-hydrated,  avoid caffeine, manage stress well.  Aortic atherosclerosis/hyperlipidemia - Upcoming labs in August with PCP. LDL goal <70.  We will request most recent labs from primary care provider.  We discussed addition of statin such as rosuvastatin or pravastatin.  She has never  before taking cholesterol medication.  Will await decision on dosing until labs received.  Reviewed lipid-lowering diet recommendations.  Her exercise is limited by prior spinal cord injury. Addendum 05/06/22: Labs via PCP 01/18/22 total cholesterol 246, direct LDL 135, triglycerides 71, HDL 82  Disposition: Follow up in 6 month(s) with Skeet Latch, MD or APP.  Signed, Loel Dubonnet, NP 04/29/2022, 4:09 PM [place]

## 2022-04-29 NOTE — Patient Instructions (Signed)
Medication Instructions:  Continue your current medications.   *If you need a refill on your cardiac medications before your next appointment, please call your pharmacy*   Lab Work: None ordered today.  Our goal is for your LDL (bad cholesterol) to be less than 70.   Testing/Procedures: None ordered today.    Follow-Up: At [facility], you and your health needs are our priority.  As part of our continuing mission to provide you with exceptional heart care, we have created designated Provider Care Teams.  These Care Teams include your primary Cardiologist (physician) and Advanced Practice Providers (APPs -  Physician Assistants and Nurse Practitioners) who all work together to provide you with the care you need, when you need it.  We recommend signing up for the patient portal called "MyChart".  Sign up information is provided on this After Visit Summary.  MyChart is used to connect with patients for Virtual Visits (Telemedicine).  Patients are able to view lab/test results, encounter notes, upcoming appointments, etc.  Non-urgent messages can be sent to your provider as well.   To learn more about what you can do with MyChart, go to [email or web address].    Your next appointment:   6 month(s)  The format for your next appointment:   In Person  Provider:   Chilton Si, MD or Alver Sorrow, NP     Other Instructions  Keep checking your blood pressure twice per week at least an hour after your medications. Call if if your blood pressure is consistently more than 130/80 or less than 110/60 .  Heart Healthy Diet Recommendations: A low-salt diet is recommended. Meats should be grilled, baked, or boiled. Avoid fried foods. Focus on lean protein sources like fish or chicken with vegetables and fruits. The American Heart Association is a Chief Technology Officer!  American Heart Association Diet and Lifeystyle Recommendations   Exercise recommendations: The American Heart Association  recommends 150 minutes of moderate intensity exercise weekly. Try 30 minutes of moderate intensity exercise 4-5 times per week. This could include walking, jogging, or swimming.   Important Information About Sugar

## 2022-04-30 NOTE — Telephone Encounter (Signed)
FYI, still trying to get the labs from PCP

## 2022-05-06 ENCOUNTER — Encounter (HOSPITAL_BASED_OUTPATIENT_CLINIC_OR_DEPARTMENT_OTHER): Payer: Self-pay

## 2022-05-25 ENCOUNTER — Other Ambulatory Visit: Payer: Self-pay | Admitting: Cardiovascular Disease

## 2022-05-27 NOTE — Telephone Encounter (Signed)
Rx request sent to pharmacy.  

## 2022-06-07 ENCOUNTER — Other Ambulatory Visit (HOSPITAL_BASED_OUTPATIENT_CLINIC_OR_DEPARTMENT_OTHER): Payer: Self-pay

## 2022-06-10 ENCOUNTER — Ambulatory Visit: Payer: 59

## 2022-06-18 ENCOUNTER — Encounter: Payer: Self-pay | Admitting: Neurology

## 2022-06-18 ENCOUNTER — Ambulatory Visit (INDEPENDENT_AMBULATORY_CARE_PROVIDER_SITE_OTHER): Payer: 59 | Admitting: Neurology

## 2022-06-18 VITALS — BP 144/85 | HR 73 | Ht 67.0 in | Wt 190.0 lb

## 2022-06-18 DIAGNOSIS — G40309 Generalized idiopathic epilepsy and epileptic syndromes, not intractable, without status epilepticus: Secondary | ICD-10-CM

## 2022-06-18 DIAGNOSIS — G43009 Migraine without aura, not intractable, without status migrainosus: Secondary | ICD-10-CM

## 2022-06-18 DIAGNOSIS — M5416 Radiculopathy, lumbar region: Secondary | ICD-10-CM | POA: Diagnosis not present
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
## 2022-06-18 NOTE — Patient Instructions (Signed)
Always good to see you. Let me know when you need refills. Discuss pain treatment with Ortho. Follow-up in 7-8 months, call for any changes.   Seizure Precautions: 1. If medication has been prescribed for you to prevent seizures, take it exactly as directed.  Do not stop taking the medicine without talking to your doctor first, even if you have not had a seizure in a long time.   2. Avoid activities in which a seizure would cause danger to yourself or to others.  Don't operate dangerous machinery, swim alone, or climb in high or dangerous places, such as on ladders, roofs, or girders.  Do not drive unless your doctor says you may.  3. If you have any warning that you may have a seizure, lay down in a safe place where you can't hurt yourself.    4.  No driving for 6 months from last seizure, as per [facility].   Please refer to the following link on the Epilepsy Foundation of America's website for more information: http://www.epilepsyfoundation.org/answerplace/Social/driving/drivingu.cfm   5.  Maintain good sleep hygiene. Avoid alcohol.  6.  Contact your doctor if you have any problems that may be related to the medicine you are taking.  7.  Call 911 and bring the patient back to the ED if:        A.  The seizure lasts longer than 5 minutes.       B.  The patient doesn't awaken shortly after the seizure  C.  The patient has new problems such as difficulty seeing, speaking or moving  D.  The patient was injured during the seizure  E.  The patient has a temperature over 102 F (39C)  F.  The patient vomited and now is having trouble breathing

## 2022-06-26 ENCOUNTER — Encounter: Payer: Self-pay | Admitting: Neurology

## 2022-07-03 NOTE — Telephone Encounter (Signed)
Closing encounter

## 2022-07-18 ENCOUNTER — Ambulatory Visit: Payer: 59 | Admitting: Neurology

## 2022-07-29 ENCOUNTER — Ambulatory Visit
Admission: RE | Admit: 2022-07-29 | Discharge: 2022-07-29 | Disposition: A | Discharge status: Home / Self Care | Payer: 59 | Source: Ambulatory Visit | Attending: Internal Medicine | Admitting: Internal Medicine

## 2022-07-29 DIAGNOSIS — Z1231 Encounter for screening mammogram for malignant neoplasm of breast: Secondary | ICD-10-CM

## 2022-08-29 ENCOUNTER — Other Ambulatory Visit: Payer: Self-pay | Admitting: Gastroenterology

## 2022-09-11 ENCOUNTER — Encounter (HOSPITAL_COMMUNITY): Payer: Self-pay | Admitting: Emergency Medicine

## 2022-09-11 ENCOUNTER — Ambulatory Visit (HOSPITAL_COMMUNITY)
Admission: EM | Admit: 2022-09-11 | Discharge: 2022-09-11 | Disposition: A | Discharge status: Home / Self Care | Payer: 59

## 2022-09-11 ENCOUNTER — Ambulatory Visit (INDEPENDENT_AMBULATORY_CARE_PROVIDER_SITE_OTHER): Payer: 59

## 2022-09-11 ENCOUNTER — Other Ambulatory Visit: Payer: Self-pay

## 2022-09-11 DIAGNOSIS — R079 Chest pain, unspecified: Secondary | ICD-10-CM | POA: Diagnosis not present

## 2022-09-11 DIAGNOSIS — M940 Chondrocostal junction syndrome [Tietze]: Secondary | ICD-10-CM

## 2022-09-11 DIAGNOSIS — R0789 Other chest pain: Secondary | ICD-10-CM

## 2022-09-11 NOTE — Discharge Instructions (Addendum)
Your x-ray and EKG were normal.  I believe that you have costochondritis.  Use over-the-counter medication such as Tylenol for pain relief.  Your baclofen should help with the pain.  Use heat and gentle stretch.  Avoid any strenuous activity.  If anything changes and you develop increased pain, shortness of breath, nausea/vomiting interfere with oral intake, sweating you need to go to the emergency room.

## 2022-09-11 NOTE — ED Provider Notes (Signed)
[facility]    CSN: [id number] Arrival date & time: 09/11/22  1717      History   Chief Complaint Chief Complaint  Patient presents with   Chest Pain    HPI [name] [name] is a 56 y.o. female.   Patient presents today with a several day history of intermittent anterior chest wall pain.  She reports that symptoms began 2 days ago and resolved after she had something to eat.  It occurred again yesterday which again resolved with oral consumption only to have recurrence today that has been spreading and changing prompting evaluation.  She reports that despite eating and drinking normally she continues to have pain which she describes as a burning/soreness over her anterior chest.  This is worse with palpation.  Denies any recent illness or additional symptoms including cough, congestion, fever.  Denies any significant shortness of breath, diaphoresis, nausea, vomiting.  She has tried gabapentin as well as aspirin 81 mg with minimal improvement of symptoms.  She reports that symptoms are intermittent without identifiable trigger.  Pain is rated 3 at rest but increases to 7 with palpation, described as aching, no alleviating factors identified.    Past Medical History:  Diagnosis Date   Aortic atherosclerosis ([place])    Aortic atherosclerosis ([place]) 05/01/2021   Chronic headaches    CKD (chronic kidney disease), stage II    Complex partial seizures (HCC)    Dyspnea 12/24/2006   B/P MV - normal perfusion in all regions; post stress LV normal in size; EF 63%; no significant ischemia noted   Hyperlipidemia    Hypertension    Palpitations 01/01/2007   30 day event monitor - 69 pt reported events- some PAC and PVCs reported   PVC's (premature ventricular contractions)    Sleep apnea    SOB (shortness of breath) 04/20/2012   MET test - exercise limiting factor - chronotropic incompetence (medication), peak VO2 76% of predicted; no EKG ST changes noted at sub-optimal peak  HR    Patient Active Problem List   Diagnosis Date Noted   Aortic atherosclerosis ([place]) 05/01/2021   Anemia due to stage 3a chronic kidney disease ([place]) 04/18/2020   Nontraumatic complete tear of right rotator cuff 03/27/2020   Leg swelling 06/17/2019   Tinnitus of both ears 01/11/2019   Lateral epicondylitis of left elbow 08/18/2018   Bulge of cervical disc without myelopathy 01/15/2018   Breast pain, left 12/18/2017   Breast tenderness in female 12/18/2017   Urinary incontinence without sensory awareness 12/18/2017   Hoarseness 09/16/2017   Chronic cough 05/13/2017   Gastroesophageal reflux disease with esophagitis 05/13/2017   Hemoptysis 05/13/2017   Phlegm in throat 12/18/2016   Other dysphagia 76/54/6503   Metabolic bone disease 54/65/6812   Vitamin D deficiency 07/10/2016   Bilateral occipital neuralgia 04/09/2016   Cervical radiculopathy 04/09/2016   Common migraine 04/09/2016   Complex partial seizure evolving to generalized seizure ([place]) 04/09/2016   Low back pain 04/09/2016   Lumbar radiculopathy 04/09/2016   Neck pain 04/09/2016   Vertigo 04/09/2016   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[place]) 04/09/2016   Pain in joint involving multiple sites 04/09/2016   OSA (obstructive sleep apnea) 04/09/2016   Rotator cuff syndrome of left shoulder 01/26/2016   Patellofemoral arthralgia of right knee 01/26/2016   Benign hypertension with chronic kidney disease, stage II 01/16/2016   NSAID long-term use 01/16/2016   Chest pain 06/28/2015   Palpitations 06/28/2015   Intermittent palpitations 06/28/2015   Irregular heart rate  07/06/2013   PVC's (premature ventricular contractions) 07/06/2013   Essential hypertension 07/06/2013    Past Surgical History:  Procedure Laterality Date   ABDOMINAL HYSTERECTOMY  02/2003   ACD  04/24/2010   Done at [facility] --C2-3 and C6 and C7   ACDF N/A 05/01/2007   Done at [facility] to [place]  05/2004   CERVICAL [place] SURGERY   12/01/2002   C4-C5 fusion and bone graft   FOOT SURGERY      OB History   No obstetric history on file.      Home Medications    Prior to Admission medications   Medication Sig Start Date End Date Taking? Authorizing Provider  docusate sodium (COLACE) 100 MG capsule Take 100 mg by mouth 2 (two) times daily.   Yes [provider]  acetaminophen (TYLENOL) 500 MG tablet Take 1,000 mg by mouth every 6 (six) hours as needed for pain (pain).    [provider]  aspirin 81 MG tablet Take 81 mg by mouth daily.    [provider]  Bacillus Coagulans-Inulin (ALIGN PREBIOTIC-PROBIOTIC PO) Take 1 capsule by mouth daily.    [provider]  baclofen (LIORESAL) 20 MG tablet Take 1 tablet (20 mg total) by mouth 3 (three) times daily. 12/04/21   Cameron Sprang, MD  diltiazem (CARDIZEM) 120 MG tablet Take 240 mg by mouth 2 (two) times daily.    [provider]  furosemide (LASIX) 20 MG tablet Take 20 mg by mouth as needed for fluid or edema.    [provider]  gabapentin (NEURONTIN) 300 MG capsule TAKE 1 CAPSULE BY MOUTH IN  THE MORNING AND 2 TO 3  CAPSULES BY MOUTH IN THE  EVENING 12/04/21   Cameron Sprang, MD  hydrALAZINE (APRESOLINE) 25 MG tablet TAKE 1 TABLET BY MOUTH  TWICE DAILY 05/27/22   Skeet Latch, MD  lamoTRIgine (LAMICTAL) 200 MG tablet Take 1 tablet (200 mg total) by mouth 2 (two) times daily. 12/04/21   Cameron Sprang, MD  Multiple Vitamins-Minerals (MULTIVITAMIN-MINERALS PO) Take by mouth.    [provider]  NON FORMULARY Take 1,000 mg by mouth daily. [facility] Vitamin D    [provider]  omeprazole (PRILOSEC OTC) 20 MG tablet Take 20 mg by mouth daily.    [provider]  ondansetron (ZOFRAN) 4 MG tablet Take 1 tablet (4 mg total) by mouth every 8 (eight) hours as needed for nausea or vomiting. 10/08/21   Wallene Huh, DPM    Family History Family History  Problem Relation Age of Onset   Diabetes  Mother    Hypertension Mother    Breast cancer Mother 37   Asthma Brother    Healthy Brother     Social History Social History   Tobacco Use   Smoking status: Never   Smokeless tobacco: Never  Vaping Use   Vaping Use: Never used  Substance Use Topics   Alcohol use: No   Drug use: No     Allergies   Camphor-menthol, Fish allergy, Fish-derived products, Sarna sensitive, Wheat bran, Adhesive [tape], Sulfa antibiotics, Sulfacetamide sodium, Sulfasalazine, Halog [halcinonide], Other, Ketoconazole, and Triamcinolone   Review of Systems Review of Systems  Constitutional:  Positive for activity change. Negative for appetite change, fatigue and fever.  Respiratory:  Negative for cough and shortness of breath.   Cardiovascular:  Positive for chest pain.  Gastrointestinal:  Negative for abdominal pain, diarrhea, nausea and vomiting.  Neurological:  Negative for dizziness, light-headedness and headaches.     Physical Exam Triage Vital Signs ED Triage Vitals  Enc Vitals Group     BP 09/11/22 1801 (!) 161/97     Pulse Rate 09/11/22 1801 71     Resp 09/11/22 1801 18     Temp 09/11/22 1801 98 F (36.7 C)     Temp Source 09/11/22 1801 Oral     SpO2 09/11/22 1801 98 %     Weight --      Height --      Head Circumference --      Peak Flow --      Pain Score 09/11/22 1759 6     Pain Loc --      Pain Edu? --      Excl. in [place]? --    No data found.  Updated Vital Signs BP (!) 161/97 (BP Location: Right Arm)   Pulse 71   Temp 98 F (36.7 C) (Oral)   Resp 18   SpO2 98%   Visual Acuity Right Eye Distance:   Left Eye Distance:   Bilateral Distance:    Right Eye Near:   Left Eye Near:    Bilateral Near:     Physical Exam Vitals reviewed.  Constitutional:      General: She is awake. She is not in acute distress.    Appearance: Normal appearance. She is well-developed. She is not ill-appearing.     Comments: Very pleasant female appears stated age in no acute distress  sitting comfortably on exam room table  HENT:     Head: Normocephalic and atraumatic.  Cardiovascular:     Rate and Rhythm: Normal rate and regular rhythm.     Heart sounds: Normal heart sounds, S1 normal and S2 normal. No murmur heard. Pulmonary:     Effort: Pulmonary effort is normal.     Breath sounds: Normal breath sounds. No wheezing, rhonchi or rales.     Comments: Clear to auscultation bilaterally Chest:     Chest wall: Tenderness present. No deformity or swelling.     Comments: Pain is reproducible on exam. Abdominal:     General: Bowel sounds are normal.     Palpations: Abdomen is soft.     Tenderness: There is no abdominal tenderness. There is no right CVA tenderness, left CVA tenderness, guarding or rebound.  Psychiatric:        Behavior: Behavior is cooperative.      UC Treatments / Results  Labs (all labs ordered are listed, but only abnormal results are displayed) Labs Reviewed - No data to display  EKG   Radiology DG Chest 2 View  Result Date: 09/11/2022 CLINICAL DATA:  Generalized chest pain EXAM: CHEST - 2 VIEW COMPARISON:  01/29/2021 FINDINGS: Frontal and lateral views of the chest demonstrate an unremarkable cardiac silhouette. No airspace disease, effusion, or pneumothorax. No acute bony abnormality. IMPRESSION: 1. No acute intrathoracic process. Electronically Signed   By: Randa Ngo M.D.   On: 09/11/2022 19:24    Procedures Procedures (including critical care time)  Medications Ordered in UC Medications - No data to display  Initial Impression / Assessment and Plan / UC Course  I have reviewed the triage vital signs and the nursing notes.  Pertinent labs & imaging results that were available during my care of the patient were reviewed by me and considered in my medical decision making (see chart for details).     EKG obtained that showed  normal sinus rhythm with ventricular rate of 72 bpm without ischemic changes; compared to 01/14/2022  tracing no significant change.  Chest x-ray was obtained that was normal.  Given pain is reproducible on exam concern for costochondritis.  Patient has many allergies and has limited in the medications that became obtained.  She does have a prescription for baclofen and we discussed that this could help with her pain so she should use this as prescribed.  She can also use Tylenol over-the-counter.  Recommended heat and gentle stretch.  She is to avoid any strenuous activity.  Recommended follow-up with her primary care.  Discussed that if she has any worsening symptoms she needs to be seen immediately.  Strict return precautions given.  Final Clinical Impressions(s) / UC Diagnoses   Final diagnoses:  Costochondritis  Chest wall pain     Discharge Instructions      Your x-ray and EKG were normal.  I believe that you have costochondritis.  Use over-the-counter medication such as Tylenol for pain relief.  Your baclofen should help with the pain.  Use heat and gentle stretch.  Avoid any strenuous activity.  If anything changes and you develop increased pain, shortness of breath, nausea/vomiting interfere with oral intake, sweating you need to go to the emergency room.     ED Prescriptions   None    PDMP not reviewed this encounter.   Terrilee Croak, PA-C 09/11/22 1939

## 2022-09-11 NOTE — ED Triage Notes (Signed)
Pt reports having generalized chest pains that are intermittent today. Will last a few minutes. Took ASA 81 mg and took her prescribed Gabapentin.   Reports left breast tenderness on yesterday

## 2022-10-29 ENCOUNTER — Ambulatory Visit (HOSPITAL_BASED_OUTPATIENT_CLINIC_OR_DEPARTMENT_OTHER): Payer: 59 | Admitting: Cardiovascular Disease

## 2022-11-04 ENCOUNTER — Ambulatory Visit (HOSPITAL_BASED_OUTPATIENT_CLINIC_OR_DEPARTMENT_OTHER): Payer: 59 | Admitting: Family

## 2022-11-28 ENCOUNTER — Ambulatory Visit (INDEPENDENT_AMBULATORY_CARE_PROVIDER_SITE_OTHER): Payer: 59 | Admitting: Pulmonary Disease

## 2022-11-28 ENCOUNTER — Encounter: Payer: Self-pay | Admitting: Pulmonary Disease

## 2022-11-28 VITALS — BP 124/80 | HR 78 | Ht 67.0 in | Wt 195.2 lb

## 2022-11-28 DIAGNOSIS — G4733 Obstructive sleep apnea (adult) (pediatric): Secondary | ICD-10-CM

## 2022-11-28 NOTE — Patient Instructions (Signed)
I will see you back a year from now  Continue using your CPAP on a nightly basis  Call with significant concerns

## 2022-11-28 NOTE — Progress Notes (Signed)
Subjective:     Patient ID: [name] [name], female   DOB: [birth year], 57 y.o.   MRN: [id number]  Patient is being seen for obstructive sleep apnea  Continues to use CPAP regularly with no significant issues  Tolerating CPAP well Getting a good nights rest Occasional daytime sleepiness  Past sleep study with severe obstructive sleep apnea  Doing well with CPAP Tries to use it nightly  Usual bedtime about 130, out of bed at 6 AM and then may go back to bed for about an hour to  No dryness of the mouth in the mornings Memory is intact No family history of obstructive sleep apnea  History of chronic neck pain, back surgery in the past       Review of Systems  Eyes: Negative.   Respiratory:  Positive for apnea.   Gastrointestinal: Negative.   Endocrine: Negative.   Genitourinary: Negative.   Psychiatric/Behavioral:  Positive for sleep disturbance.    Past Medical History:  Diagnosis Date   Aortic atherosclerosis (HCC)    Aortic atherosclerosis ([place]) 05/01/2021   Chronic headaches    CKD (chronic kidney disease), stage II    Complex partial seizures (HCC)    Dyspnea 12/24/2006   B/P MV - normal perfusion in all regions; post stress LV normal in size; EF 63%; no significant ischemia noted   Hyperlipidemia    Hypertension    Palpitations 01/01/2007   30 day event monitor - 69 pt reported events- some PAC and PVCs reported   PVC's (premature ventricular contractions)    Sleep apnea    SOB (shortness of breath) 04/20/2012   MET test - exercise limiting factor - chronotropic incompetence (medication), peak VO2 76% of predicted; no EKG ST changes noted at sub-optimal peak HR   Social History   Socioeconomic History   Marital status: Married    Spouse name: Not on file   Number of children: Not on file   Years of education: Not on file   Highest education level: Not on file  Occupational History   Not on file  Tobacco Use   Smoking status: Never    Smokeless tobacco: Never  Vaping Use   Vaping Use: Never used  Substance and Sexual Activity   Alcohol use: No   Drug use: No   Sexual activity: Not on file  Other Topics Concern   Not on file  Social History Narrative   Pt is R handed   Lives in 2 story home with her husband, Girard Cooter.   Has 1 biological child / 1 step child   Associated degree in nursing   Disabled   Caffeine 16 0z daily   Social Determinants of Health   Financial Resource Strain: Not on file  Food Insecurity: Not on file  Transportation Needs: Not on file  Physical Activity: Not on file  Stress: Not on file  Social Connections: Not on file  Intimate Partner Violence: Not on file   Family History  Problem Relation Age of Onset   Diabetes Mother    Hypertension Mother    Breast cancer Mother 60   Asthma Brother    Healthy Brother        Objective:   Physical Exam Constitutional:      Appearance: Normal appearance.  HENT:     Nose: No congestion.  Eyes:     General: No scleral icterus. Cardiovascular:     Rate and Rhythm: Normal rate and regular rhythm.     Pulses:  Normal pulses.     Heart sounds: Normal heart sounds. No murmur heard.    No friction rub.  Pulmonary:     Effort: Pulmonary effort is normal. No respiratory distress.     Breath sounds: Normal breath sounds. No stridor. No wheezing or rhonchi.  Musculoskeletal:     Cervical back: No muscular tenderness.  Neurological:     Mental Status: She is alert.  Psychiatric:        Mood and Affect: Mood normal.    Vitals:   11/28/22 1546  BP: 124/80  Pulse: 78  SpO2: 99%      11/28/2022    3:00 PM 05/24/2019    4:00 PM  Results of the Epworth flowsheet  Sitting and reading 1 3  Watching TV 1 3  Sitting, inactive in a public place (e.g. a theatre or a meeting) 0 1  As a passenger in a car for an hour without a break 2 3  Lying down to rest in the afternoon when circumstances permit 0 2  Sitting and talking to someone 0 0   Sitting quietly after a lunch without alcohol 0 3  In a car, while stopped for a few minutes in traffic 0 2  Total score 4 17     Recent sleep study reviewed by myself Compliance data reveals excellent compliance with AHI below 2.7  Most recent compliance data reveals 90% compliance Average use of 5 hours 29 minutes AutoSet 5-15 Residual AHI of 3.1 Assessment:     Severe obstructive sleep apnea -Compliant with CPAP -Residual AHI of 3.1  Energy levels appears to be stable  Sleep quality is good    Plan:     Continue CPAP therapy  Stay active  Call with significant concerns  Follow-up in a year

## 2022-12-03 ENCOUNTER — Encounter: Payer: Self-pay | Admitting: Pulmonary Disease

## 2022-12-04 NOTE — Telephone Encounter (Signed)
As long as you continue to use your CPAP nightly you should be fine  If you require a small dose of medication to help you sleep, we can try that.  You just have to make sure you are using your CPAP nightly

## 2022-12-13 ENCOUNTER — Encounter (HOSPITAL_COMMUNITY): Payer: Self-pay | Admitting: Gastroenterology

## 2022-12-19 NOTE — Anesthesia Preprocedure Evaluation (Addendum)
Anesthesia Evaluation  Patient identified by MRN, date of birth, ID band Patient awake    Reviewed: Allergy & Precautions, NPO status , Patient's Chart, lab work & pertinent test results  Airway Mallampati: III  TM Distance: >3 FB Neck ROM: Limited    Dental   Pulmonary sleep apnea and Continuous Positive Airway Pressure Ventilation    Pulmonary exam normal        Cardiovascular hypertension, Pt. on medications  Rhythm:Regular Rate:Normal     Neuro/Psych  Headaches, Seizures -,   Neuromuscular disease    GI/Hepatic negative GI ROS, Neg liver ROS,,,  Endo/Other  negative endocrine ROS    Renal/GU Renal disease     Musculoskeletal  (+) Arthritis ,    Abdominal   Peds  Hematology negative hematology ROS (+)   Anesthesia Other Findings   Reproductive/Obstetrics                             Anesthesia Physical Anesthesia Plan  ASA: 2  Anesthesia Plan: MAC   Post-op Pain Management:    Induction:   PONV Risk Score and Plan: 2 and Propofol infusion  Airway Management Planned: Natural Airway and Simple Face Mask  Additional Equipment:   Intra-op Plan:   Post-operative Plan:   Informed Consent: I have reviewed the patients History and Physical, chart, labs and discussed the procedure including the risks, benefits and alternatives for the proposed anesthesia with the patient or authorized representative who has indicated his/her understanding and acceptance.       Plan Discussed with:   Anesthesia Plan Comments:        Anesthesia Quick Evaluation

## 2022-12-20 ENCOUNTER — Encounter (HOSPITAL_COMMUNITY): Payer: Self-pay | Admitting: Gastroenterology

## 2022-12-20 ENCOUNTER — Encounter (HOSPITAL_COMMUNITY)
Admission: RE | Disposition: A | Discharge status: Home / Self Care | Payer: Self-pay | Source: Ambulatory Visit | Attending: Gastroenterology

## 2022-12-20 ENCOUNTER — Ambulatory Visit (HOSPITAL_BASED_OUTPATIENT_CLINIC_OR_DEPARTMENT_OTHER): Payer: 59 | Admitting: Anesthesiology

## 2022-12-20 ENCOUNTER — Ambulatory Visit (HOSPITAL_COMMUNITY): Payer: 59 | Admitting: Anesthesiology

## 2022-12-20 ENCOUNTER — Ambulatory Visit (HOSPITAL_COMMUNITY)
Admission: RE | Admit: 2022-12-20 | Discharge: 2022-12-20 | Disposition: A | Discharge status: Home / Self Care | Payer: 59 | Source: Ambulatory Visit | Attending: Gastroenterology | Admitting: Gastroenterology

## 2022-12-20 DIAGNOSIS — I10 Essential (primary) hypertension: Secondary | ICD-10-CM | POA: Diagnosis not present

## 2022-12-20 DIAGNOSIS — Z1211 Encounter for screening for malignant neoplasm of colon: Secondary | ICD-10-CM

## 2022-12-20 DIAGNOSIS — K5730 Diverticulosis of large intestine without perforation or abscess without bleeding: Secondary | ICD-10-CM | POA: Insufficient documentation

## 2022-12-20 DIAGNOSIS — K5790 Diverticulosis of intestine, part unspecified, without perforation or abscess without bleeding: Secondary | ICD-10-CM

## 2022-12-20 DIAGNOSIS — N182 Chronic kidney disease, stage 2 (mild): Secondary | ICD-10-CM | POA: Diagnosis not present

## 2022-12-20 DIAGNOSIS — I129 Hypertensive chronic kidney disease with stage 1 through stage 4 chronic kidney disease, or unspecified chronic kidney disease: Secondary | ICD-10-CM | POA: Diagnosis not present

## 2022-12-20 DIAGNOSIS — Z79899 Other long term (current) drug therapy: Secondary | ICD-10-CM | POA: Insufficient documentation

## 2022-12-20 DIAGNOSIS — K219 Gastro-esophageal reflux disease without esophagitis: Secondary | ICD-10-CM | POA: Insufficient documentation

## 2022-12-20 DIAGNOSIS — G4730 Sleep apnea, unspecified: Secondary | ICD-10-CM | POA: Insufficient documentation

## 2022-12-20 DIAGNOSIS — G4733 Obstructive sleep apnea (adult) (pediatric): Secondary | ICD-10-CM

## 2022-12-20 DIAGNOSIS — Z9989 Dependence on other enabling machines and devices: Secondary | ICD-10-CM

## 2022-12-20 HISTORY — PX: COLONOSCOPY WITH PROPOFOL: SHX5780

## 2022-12-20 SURGERY — COLONOSCOPY WITH PROPOFOL
Anesthesia: Monitor Anesthesia Care

## 2022-12-20 MED ORDER — LACTATED RINGERS IV SOLN
INTRAVENOUS | Status: AC | PRN
  Administered 2022-12-20: 20 mL/h via INTRAVENOUS

## 2022-12-20 MED ORDER — SODIUM CHLORIDE 0.9 % IV SOLN: INTRAVENOUS | Status: DC

## 2022-12-20 MED ORDER — PROPOFOL 500 MG/50ML IV EMUL
INTRAVENOUS | Status: DC | PRN
  Administered 2022-12-20: 125 ug/kg/min via INTRAVENOUS

## 2022-12-20 MED ORDER — LACTATED RINGERS IV SOLN: INTRAVENOUS | Status: DC

## 2022-12-20 MED ORDER — PROPOFOL 10 MG/ML IV BOLUS
INTRAVENOUS | Status: DC | PRN
  Administered 2022-12-20: 30 mg via INTRAVENOUS

## 2022-12-20 SURGICAL SUPPLY — 22 items

## 2022-12-20 NOTE — Transfer of Care (Signed)
Immediate Anesthesia Transfer of Care Note  Patient: [name] [name]  Procedure(s) Performed: COLONOSCOPY WITH PROPOFOL  Patient Location: PACU  Anesthesia Type:MAC  Level of Consciousness: awake and patient cooperative  Airway & Oxygen Therapy: Patient Spontanous Breathing and Patient connected to face mask  Post-op Assessment: Report given to RN and Post -op Vital signs reviewed and stable  Post vital signs: Reviewed and stable  Last Vitals:  Vitals Value Taken Time  BP    Temp    Pulse    Resp    SpO2      Last Pain:  Vitals:   12/20/22 0700  TempSrc: Oral  PainSc: 7          Complications: No notable events documented.

## 2022-12-20 NOTE — H&P (Signed)
[name] [name] HPI: This 57 year old black female presents to the office for colorectal cancer screening. She has 1-2 BM's per day but can go 3-4 days without having a BM. A few days ago, she noticed some bright red blood on the toilet tissue after a BM. She is taking Omeprazole for acid reflux with good control. She has a good appetite and her weight has been stable. She has had some periumbilical pain when she is constipated. She denies having any complaints of  nausea, vomiting, dysphagia or odynophagia. She denies having a family history of colon cancer, celiac sprue or IBD. Her last colonoscopy done on 08/06/2012 revealed a few scattered diverticula in the sigmoid colon but the exam was otherwise normal.  Past Medical History:  Diagnosis Date   Aortic atherosclerosis (HCC)    Aortic atherosclerosis ([place]) 05/01/2021   Chronic headaches    CKD (chronic kidney disease), stage II    Complex partial seizures (HCC)    Dyspnea 12/24/2006   B/P MV - normal perfusion in all regions; post stress LV normal in size; EF 63%; no significant ischemia noted   Hyperlipidemia    Hypertension    Palpitations 01/01/2007   30 day event monitor - 69 pt reported events- some PAC and PVCs reported   PVC's (premature ventricular contractions)    Sleep apnea    SOB (shortness of breath) 04/20/2012   MET test - exercise limiting factor - chronotropic incompetence (medication), peak VO2 76% of predicted; no EKG ST changes noted at sub-optimal peak HR    Past Surgical History:  Procedure Laterality Date   ABDOMINAL HYSTERECTOMY  02/2003   ACD  04/24/2010   Done at [facility] --C2-3 and C6 and C7   ACDF N/A 05/01/2007   Done at [facility] --C5 to [place]  05/2004   CERVICAL [place] SURGERY  12/01/2002   C4-C5 fusion and bone graft   FOOT SURGERY      Family History  Problem Relation Age of Onset   Diabetes Mother    Hypertension Mother    Breast cancer Mother 23   Asthma Brother     Healthy Brother     Social History:  reports that she has never smoked. She has never used smokeless tobacco. She reports that she does not drink alcohol and does not use drugs.  Allergies:  Allergies  Allergen Reactions   Camphor-Menthol Anaphylaxis   Fish Allergy Anaphylaxis    Respiratory Distress   Fish-Derived Products Anaphylaxis   Sarna Sensitive Anaphylaxis    Respiratory distress with the Sarna lotion   Wheat Bran Anaphylaxis   Adhesive [Tape] Other (See Comments)    Tape, Electrode, and Band aid Adhesive Leaves "burn Marks"      Sulfa Antibiotics Other (See Comments)     "General malaze"   Sulfacetamide Sodium      "General malaise   Sulfasalazine      "General malaze"   Halog [Halcinonide]     Cough per patient    Other Other (See Comments)    EKG LEADS OR ADHESIVE CAUSES BURNS OK TO USE CHILD EKG LEADS   Ketoconazole Cough     Nose/eyes watery   Triamcinolone Cough     Watery eyes/nose    Medications: Scheduled: Continuous:  sodium chloride     lactated ringers     lactated ringers 20 mL/hr (12/20/22 0710)    No results found for this or any previous visit (from the past  24 hour(s)).   No results found.  ROS:  As stated above in the HPI otherwise negative.  Blood pressure (!) 169/76, pulse 92, temperature (!) 97 F (36.1 C), temperature source Oral, resp. rate 20, height 5\' 7"  (1.702 m), weight 85.7 kg, SpO2 98 %.    PE: Gen: NAD, Alert and Oriented HEENT:  [place]/AT, EOMI Neck: Supple, no LAD Lungs: CTA Bilaterally CV: RRR without M/G/R ABD: Soft, NTND, +BS Ext: No C/C/E  Assessment/Plan: 1) Screening colonoscopy.  Yehudah Standing D 12/20/2022, 7:29 AM

## 2022-12-20 NOTE — Discharge Instructions (Signed)

## 2022-12-20 NOTE — Anesthesia Procedure Notes (Signed)
Procedure Name: MAC Date/Time: 12/20/2022 7:30 AM  Performed by: Claudia Desanctis, CRNAPre-anesthesia Checklist: Patient identified, Emergency Drugs available, Suction available and Patient being monitored Patient Re-evaluated:Patient Re-evaluated prior to induction Oxygen Delivery Method: Simple face mask

## 2022-12-20 NOTE — Op Note (Signed)
[facility] Patient Name: [name] [name] Procedure Date: 12/20/2022 MRN: [id number] Attending MD: Carol Ada , MD, [id number] Date of Birth: May 25, 1966 CSN: [id number] Age: 57 Admit Type: Outpatient Procedure:                Colonoscopy Indications:              Screening for colorectal malignant neoplasm Providers:                Carol Ada, MD, Dulcy Fanny, Cherylynn Ridges,                            Technician, Dellie Catholic Referring MD:              Medicines:                Propofol per Anesthesia Complications:            No immediate complications. Estimated Blood Loss:     Estimated blood loss: none. Procedure:                Pre-Anesthesia Assessment:                           - Prior to the procedure, a History and Physical                            was performed, and patient medications and                            allergies were reviewed. The patient's tolerance of                            previous anesthesia was also reviewed. The risks                            and benefits of the procedure and the sedation                            options and risks were discussed with the patient.                            All questions were answered, and informed consent                            was obtained. Prior Anticoagulants: The patient has                            taken no anticoagulant or antiplatelet agents. ASA                            Grade Assessment: III - A patient with severe                            systemic disease. After reviewing the risks and  benefits, the patient was deemed in satisfactory                            condition to undergo the procedure.                           - Sedation was administered by an anesthesia                            professional. Deep sedation was attained.                           After obtaining informed consent, the colonoscope                            was passed  under direct vision. Throughout the                            procedure, the patient's blood pressure, pulse, and                            oxygen saturations were monitored continuously. The                            CF-HQ190L ([id number]) Olympus colonoscope was                            introduced through the anus and advanced to the the                            cecum, identified by appendiceal orifice and                            ileocecal valve. The colonoscopy was somewhat                            difficult due to poor endoscopic visualization.                            Successful completion of the procedure was aided by                            lavage. The patient tolerated the procedure well.                            The quality of the bowel preparation was evaluated                            using the BBPS [facility] Bowel Preparation Scale)                            with scores of: Right Colon = 2 (minor amount of  residual staining, small fragments of stool and/or                            opaque liquid, but mucosa seen well), Transverse                            Colon = 2 (minor amount of residual staining, small                            fragments of stool and/or opaque liquid, but mucosa                            seen well) and Left Colon = 2 (minor amount of                            residual staining, small fragments of stool and/or                            opaque liquid, but mucosa seen well). The total                            BBPS score equals 6. The quality of the bowel                            preparation was good. The ileocecal valve,                            appendiceal orifice, and rectum were photographed. Scope In: 7:38:54 AM Scope Out: 8:02:01 AM Scope Withdrawal Time: 0 hours 14 minutes 2 seconds  Total Procedure Duration: 0 hours 23 minutes 7 seconds  Findings:      Scattered medium-mouthed and small-mouthed  diverticula were found in the       sigmoid colon and descending colon. Impression:               - Diverticulosis in the sigmoid colon and in the                            descending colon.                           - No specimens collected. Moderate Sedation:      Not Applicable - Patient had care per Anesthesia. Recommendation:           - Patient has a contact number available for                            emergencies. The signs and symptoms of potential                            delayed complications were discussed with the                            patient. Return to normal activities tomorrow.  Written discharge instructions were provided to the                            patient.                           - Resume previous diet.                           - Continue present medications.                           - Repeat colonoscopy in 10 years for screening                            purposes. Procedure Code(s):        --- Professional ---                           [phone], Colonoscopy, flexible; diagnostic, including                            collection of specimen(s) by brushing or washing,                            when performed (separate procedure) Diagnosis Code(s):        --- Professional ---                           Z12.11, Encounter for screening for malignant                            neoplasm of colon                           K57.30, Diverticulosis of large intestine without                            perforation or abscess without bleeding CPT copyright 2022 American [employer] Association. All rights reserved. The codes documented in this report are preliminary and upon coder review may  be revised to meet current compliance requirements. Carol Ada, MD Carol Ada, MD 12/20/2022 8:09:15 AM This report has been signed electronically. Number of Addenda: 0

## 2022-12-22 ENCOUNTER — Encounter (HOSPITAL_COMMUNITY): Payer: Self-pay | Admitting: Gastroenterology

## 2022-12-23 NOTE — Anesthesia Postprocedure Evaluation (Signed)
Anesthesia Post Note  Patient: [name] [name]  Procedure(s) Performed: COLONOSCOPY WITH PROPOFOL     Patient location during evaluation: PACU Anesthesia Type: MAC Level of consciousness: awake and alert Pain management: pain level controlled Vital Signs Assessment: post-procedure vital signs reviewed and stable Respiratory status: spontaneous breathing, nonlabored ventilation, respiratory function stable and patient connected to nasal cannula oxygen Cardiovascular status: stable and blood pressure returned to baseline Postop Assessment: no apparent nausea or vomiting Anesthetic complications: no   No notable events documented.  Last Vitals:  Vitals:   12/20/22 0823 12/20/22 0828  BP: 139/80 (!) 140/79  Pulse: 68 68  Resp: 18 13  Temp:    SpO2: 98% 100%    Last Pain:  Vitals:   12/20/22 0828  TempSrc:   PainSc: 0-No pain                 Tiajuana Amass

## 2023-01-02 ENCOUNTER — Telehealth: Payer: Self-pay | Admitting: Neurology

## 2023-01-02 MED ORDER — LAMOTRIGINE 200 MG PO TABS: 200.0000 mg | ORAL_TABLET | Freq: Two times a day (BID) | ORAL | 0 refills | Status: DC

## 2023-01-02 NOTE — Telephone Encounter (Signed)
1. Which medications need refilled? (List name and dosage, if known) lamotrigine - [facility] faxed request a week ago, but has not heard from [place]  2. Which pharmacy/location is medication to be sent to? (include street and city if local pharmacy) [facility]

## 2023-01-02 NOTE — Telephone Encounter (Signed)
Rx  send in for pt,

## 2023-01-20 ENCOUNTER — Encounter: Payer: Self-pay | Admitting: Neurology

## 2023-01-20 ENCOUNTER — Telehealth: Payer: Self-pay | Admitting: Neurology

## 2023-01-20 ENCOUNTER — Telehealth (INDEPENDENT_AMBULATORY_CARE_PROVIDER_SITE_OTHER): Payer: 59 | Admitting: Neurology

## 2023-01-20 VITALS — BP 137/80 | HR 62 | Temp 98.4°F | Ht 67.0 in | Wt 189.0 lb

## 2023-01-20 DIAGNOSIS — G43009 Migraine without aura, not intractable, without status migrainosus: Secondary | ICD-10-CM | POA: Diagnosis not present

## 2023-01-20 DIAGNOSIS — M5416 Radiculopathy, lumbar region: Secondary | ICD-10-CM | POA: Diagnosis not present

## 2023-01-20 DIAGNOSIS — G40309 Generalized idiopathic epilepsy and epileptic syndromes, not intractable, without status epilepticus: Secondary | ICD-10-CM

## 2023-01-20 MED ORDER — GABAPENTIN 300 MG PO CAPS: ORAL_CAPSULE | ORAL | 3 refills | Status: DC

## 2023-01-20 MED ORDER — LAMOTRIGINE 200 MG PO TABS: 200.0000 mg | ORAL_TABLET | Freq: Two times a day (BID) | ORAL | 0 refills | Status: DC

## 2023-01-20 MED ORDER — BACLOFEN 20 MG PO TABS: 20.0000 mg | ORAL_TABLET | Freq: Three times a day (TID) | ORAL | 3 refills | Status: DC

## 2023-01-20 NOTE — Patient Instructions (Signed)
Good to see you.  Continue to monitor migraines as weather stabilizes and sleep hygiene improves  2. Refills sent for Gabapentin, Lamotrigine, and Baclofen  3. Follow-up in 6 months, call for any changes   Seizure Precautions: 1. If medication has been prescribed for you to prevent seizures, take it exactly as directed.  Do not stop taking the medicine without talking to your doctor first, even if you have not had a seizure in a long time.   2. Avoid activities in which a seizure would cause danger to yourself or to others.  Don't operate dangerous machinery, swim alone, or climb in high or dangerous places, such as on ladders, roofs, or girders.  Do not drive unless your doctor says you may.  3. If you have any warning that you may have a seizure, lay down in a safe place where you can't hurt yourself.    4.  No driving for 6 months from last seizure, as per [facility].   Please refer to the following link on the [place] website for more information: http://www.epilepsyfoundation.org/answerplace/Social/driving/drivingu.cfm   5.  Maintain good sleep hygiene. Avoid alcohol.  6.  Contact your doctor if you have any problems that may be related to the medicine you are taking.  7.  Call 911 and bring the patient back to the ED if:        A.  The seizure lasts longer than 5 minutes.       B.  The patient doesn't awaken shortly after the seizure  C.  The patient has new problems such as difficulty seeing, speaking or moving  D.  The patient was injured during the seizure  E.  The patient has a temperature over 102 F (39C)  F.  The patient vomited and now is having trouble breathing
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
## 2023-01-20 NOTE — Telephone Encounter (Signed)
Called to make 6 month follow up appt with [name] [name]. Patient states that she will call back to get schedule

## 2023-02-09 ENCOUNTER — Ambulatory Visit (HOSPITAL_COMMUNITY)
Admission: EM | Admit: 2023-02-09 | Discharge: 2023-02-09 | Disposition: A | Discharge status: Home / Self Care | Payer: 59 | Attending: Physician Assistant | Admitting: Physician Assistant

## 2023-02-09 ENCOUNTER — Encounter (HOSPITAL_COMMUNITY): Payer: Self-pay

## 2023-02-09 ENCOUNTER — Ambulatory Visit (INDEPENDENT_AMBULATORY_CARE_PROVIDER_SITE_OTHER): Payer: 59

## 2023-02-09 DIAGNOSIS — J069 Acute upper respiratory infection, unspecified: Secondary | ICD-10-CM | POA: Diagnosis not present

## 2023-02-09 DIAGNOSIS — R059 Cough, unspecified: Secondary | ICD-10-CM

## 2023-02-09 DIAGNOSIS — J209 Acute bronchitis, unspecified: Secondary | ICD-10-CM | POA: Diagnosis not present

## 2023-02-09 DIAGNOSIS — R051 Acute cough: Secondary | ICD-10-CM

## 2023-02-09 MED ORDER — AZELASTINE HCL 0.1 % NA SOLN: 2.0000 | Freq: Two times a day (BID) | NASAL | 12 refills | Status: DC

## 2023-02-09 MED ORDER — DM-GUAIFENESIN ER 30-600 MG PO TB12: 1.0000 | ORAL_TABLET | Freq: Two times a day (BID) | ORAL | 0 refills | Status: DC

## 2023-02-09 NOTE — ED Triage Notes (Addendum)
Patient c/o nasal congestion, chills, headache, hoarseness, a non productive cough, and SOB since 01/19/23.  Patient states she has been taking Coricidin, multi symptom cold and flu.

## 2023-02-09 NOTE — Discharge Instructions (Addendum)
Advised to take Mucinex DM every 12 hours for cough and congestion. Advised to use the Astelin nasal spray, 2 sprays each nostril once daily as this will help decrease postnasal drip and drainage.  Sometimes consistent postnasal drip will cause recurrent cough spasm.  Advised follow-up PCP return to urgent care as needed.

## 2023-02-09 NOTE — ED Provider Notes (Signed)
[place]    CSN: [id number] Arrival date & time: 02/09/23  1008      History   Chief Complaint Chief Complaint  Patient presents with   Nasal Congestion   Chills   Headache   Cough   Hoarse   Shortness of Breath    HPI [name] [name] is a 57 y.o. female.   57 year old female presents with cough, chest congestion.  Patient indicates for the past 4 weeks she has been having recurrent upper respiratory congestion with rhinitis, persistent postnasal drip, clear production.  Patient indicates she has also been having chest congestion with intermittent cough.  She indicates that the cough was originally productive with yellow sputum but it is since become dry.  She indicates that the cough is intermittent without wheezing or shortness of breath.  She does indicate having chest wall soreness due to the cough.  She is without fever or chills.  She is currently not taking any medicine OTC to help control the cough because of it being intermittent.  Patient indicates that she is concerned due to the duration of the intermittent cough and congestion.  She is tolerating fluids well.  Does request chest x-ray.   Headache Associated symptoms: cough   Cough Associated symptoms: headaches and shortness of breath   Shortness of Breath Associated symptoms: cough and headaches     Past Medical History:  Diagnosis Date   Aortic atherosclerosis (HCC)    Aortic atherosclerosis ([place]) 05/01/2021   Chronic headaches    CKD (chronic kidney disease), stage II    Complex partial seizures (HCC)    Dyspnea 12/24/2006   B/P MV - normal perfusion in all regions; post stress LV normal in size; EF 63%; no significant ischemia noted   Hyperlipidemia    Hypertension    Palpitations 01/01/2007   30 day event monitor - 69 pt reported events- some PAC and PVCs reported   PVC's (premature ventricular contractions)    Sleep apnea    SOB (shortness of breath) 04/20/2012   MET test -  exercise limiting factor - chronotropic incompetence (medication), peak VO2 76% of predicted; no EKG ST changes noted at sub-optimal peak HR    Patient Active Problem List   Diagnosis Date Noted   Aortic atherosclerosis ([place]) 05/01/2021   Anemia due to stage 3a chronic kidney disease ([place]) 04/18/2020   Nontraumatic complete tear of right rotator cuff 03/27/2020   Leg swelling 06/17/2019   Tinnitus of both ears 01/11/2019   Lateral epicondylitis of left elbow 08/18/2018   Bulge of cervical disc without myelopathy 01/15/2018   Breast pain, left 12/18/2017   Breast tenderness in female 12/18/2017   Urinary incontinence without sensory awareness 12/18/2017   Hoarseness 09/16/2017   Chronic cough 05/13/2017   Gastroesophageal reflux disease with esophagitis 05/13/2017   Hemoptysis 05/13/2017   Phlegm in throat 12/18/2016   Other dysphagia [id number]   Metabolic bone disease [id number]   Vitamin D deficiency 07/10/2016   Bilateral occipital neuralgia 04/09/2016   Cervical radiculopathy 04/09/2016   Common migraine 04/09/2016   Complex partial seizure evolving to generalized seizure ([place]) 04/09/2016   Low back pain 04/09/2016   Lumbar radiculopathy 04/09/2016   Neck pain 04/09/2016   Vertigo 04/09/2016   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[place]) 04/09/2016   Pain in joint involving multiple sites 04/09/2016   OSA (obstructive sleep apnea) 04/09/2016   Rotator cuff syndrome of left shoulder 01/26/2016   Patellofemoral arthralgia of right knee 01/26/2016   Benign hypertension  with chronic kidney disease, stage II 01/16/2016   NSAID long-term use 01/16/2016   Chest pain 06/28/2015   Palpitations 06/28/2015   Intermittent palpitations 06/28/2015   Irregular heart rate 07/06/2013   PVC's (premature ventricular contractions) 07/06/2013   Essential hypertension 07/06/2013    Past Surgical History:  Procedure Laterality Date   ABDOMINAL HYSTERECTOMY  02/2003   ACD  04/24/2010   Done at [facility]  --C2-3 and C6 and C7   ACDF N/A 05/01/2007   Done at [place] to [place]  05/2004   CERVICAL [place] SURGERY  12/01/2002   C4-C5 fusion and bone graft   COLONOSCOPY WITH PROPOFOL N/A 12/20/2022   Procedure: COLONOSCOPY WITH PROPOFOL;  Surgeon: Carol Ada, MD;  Location: [facility];  Service: Gastroenterology;  Laterality: N/A;   FOOT SURGERY      OB History   No obstetric history on file.      Home Medications    Prior to Admission medications   Medication Sig Start Date End Date Taking? Authorizing Provider  azelastine (ASTELIN) 0.1 % nasal spray Place 2 sprays into both nostrils 2 (two) times daily. Use in each nostril as directed 02/09/23  Yes Nyoka Lint, PA-C  dextromethorphan-guaiFENesin [facility] DM) 30-600 MG 12hr tablet Take 1 tablet by mouth 2 (two) times daily. 02/09/23  Yes Nyoka Lint, PA-C  acetaminophen (TYLENOL) 500 MG tablet Take 1,000 mg by mouth every 6 (six) hours as needed for pain (pain).    [provider]  aspirin 81 MG tablet Take 81 mg by mouth daily.    [provider]  Bacillus Coagulans-Inulin (ALIGN PREBIOTIC-PROBIOTIC PO) Take 1 capsule by mouth daily.    [provider]  baclofen (LIORESAL) 20 MG tablet Take 1 tablet (20 mg total) by mouth 3 (three) times daily. 01/20/23   Cameron Sprang, MD  diltiazem (CARDIZEM) 120 MG tablet Take 240 mg by mouth 2 (two) times daily.    [provider]  docusate sodium (COLACE) 100 MG capsule Take 100 mg by mouth 2 (two) times daily.    [provider]  furosemide (LASIX) 20 MG tablet Take 20 mg by mouth as needed for fluid or edema.    [provider]  gabapentin (NEURONTIN) 300 MG capsule TAKE 1 CAPSULE BY MOUTH IN  THE MORNING AND 2 TO 3  CAPSULES BY MOUTH IN THE  EVENING 01/20/23   Cameron Sprang, MD  hydrALAZINE (APRESOLINE) 25 MG tablet TAKE 1 TABLET BY MOUTH  TWICE DAILY 05/27/22   Skeet Latch, MD  lamoTRIgine (LAMICTAL) 200 MG  tablet Take 1 tablet (200 mg total) by mouth 2 (two) times daily. 01/20/23   Cameron Sprang, MD  Multiple Vitamins-Minerals (MULTIVITAMIN-MINERALS PO) Take by mouth.    [provider]  NON FORMULARY Take 1,000 mg by mouth daily. [facility] Vitamin D    [provider]  omeprazole (PRILOSEC OTC) 20 MG tablet Take 20 mg by mouth daily.    [provider]    Family History Family History  Problem Relation Age of Onset   Diabetes Mother    Hypertension Mother    Breast cancer Mother 58   Asthma Brother    Healthy Brother     Social History Social History   Tobacco Use   Smoking status: Never   Smokeless tobacco: Never  Vaping Use   Vaping Use: Never used  Substance Use Topics   Alcohol use: No   Drug use: No  Allergies   Camphor-menthol, Fish allergy, Fish-derived products, Sarna sensitive, Wheat, Adhesive [tape], Sulfa antibiotics, Sulfacetamide sodium, Sulfasalazine, Halog [halcinonide], Other, Ketoconazole, and Triamcinolone   Review of Systems Review of Systems  Respiratory:  Positive for cough and shortness of breath.   Neurological:  Positive for headaches.     Physical Exam Triage Vital Signs ED Triage Vitals  Enc Vitals Group     BP 02/09/23 1059 (!) 143/75     Pulse Rate 02/09/23 1059 65     Resp 02/09/23 1059 16     Temp 02/09/23 1059 98 F (36.7 C)     Temp Source 02/09/23 1059 Oral     SpO2 02/09/23 1059 96 %     Weight --      Height --      Head Circumference --      Peak Flow --      Pain Score 02/09/23 1100 5     Pain Loc --      Pain Edu? --      Excl. in [place]? --    No data found.  Updated Vital Signs BP (!) 143/75 (BP Location: Right Arm)   Pulse 65   Temp 98 F (36.7 C) (Oral)   Resp 16   SpO2 96%   Visual Acuity Right Eye Distance:   Left Eye Distance:   Bilateral Distance:    Right Eye Near:   Left Eye Near:    Bilateral Near:     Physical Exam Constitutional:      Appearance: She is  well-developed.  HENT:     Right Ear: Tympanic membrane and ear canal normal.     Left Ear: Tympanic membrane and ear canal normal.     Mouth/Throat:     Mouth: Mucous membranes are moist.     Pharynx: Oropharynx is clear.  Cardiovascular:     Rate and Rhythm: Normal rate and regular rhythm.     Heart sounds: Normal heart sounds.  Pulmonary:     Effort: Pulmonary effort is normal.     Breath sounds: Normal breath sounds and air entry. No wheezing, rhonchi or rales.  Lymphadenopathy:     Cervical: No cervical adenopathy.  Neurological:     Mental Status: She is alert.      UC Treatments / Results  Labs (all labs ordered are listed, but only abnormal results are displayed) Labs Reviewed - No data to display  EKG   Radiology DG Chest 2 View  Result Date: 02/09/2023 CLINICAL DATA:  Cough. EXAM: CHEST - 2 VIEW COMPARISON:  September 11, 2022. FINDINGS: The heart size and mediastinal contours are within normal limits. Both lungs are clear. The visualized skeletal structures are unremarkable. IMPRESSION: No active cardiopulmonary disease. Electronically Signed   By: Marijo Conception M.D.   On: 02/09/2023 12:22    Procedures Procedures (including critical care time)  Medications Ordered in UC Medications - No data to display  Initial Impression / Assessment and Plan / UC Course  I have reviewed the triage vital signs and the nursing notes.  Pertinent labs & imaging results that were available during my care of the patient were reviewed by me and considered in my medical decision making (see chart for details).    Plan: The diagnosis to be treated with the following: Upper respiratory tract infection: 1.  Mucinex DM every 12 hours to control cough and congestion. 2.  Acute cough: A.  Mucinex DM every 12 hours to control cough  and congestion. 3.  Acute bronchitis: A.  Advised take Mucinex DM every 12 hours to control cough and congestion. B.  Astelin nasal spray, 2 sprays  each nostril once a day to help control chronic postnasal drip. 3.  Advised follow-up PCP return to urgent care as needed. Final Clinical Impressions(s) / UC Diagnoses   Final diagnoses:  Viral upper respiratory tract infection  Acute cough  Acute bronchitis, unspecified organism     Discharge Instructions      Advised to take Mucinex DM every 12 hours for cough and congestion. Advised to use the Astelin nasal spray, 2 sprays each nostril once daily as this will help decrease postnasal drip and drainage.  Sometimes consistent postnasal drip will cause recurrent cough spasm.  Advised follow-up PCP return to urgent care as needed.    ED Prescriptions     Medication Sig Dispense Auth. Provider   dextromethorphan-guaiFENesin (MUCINEX DM) 30-600 MG 12hr tablet Take 1 tablet by mouth 2 (two) times daily. 20 tablet Nyoka Lint, PA-C   azelastine (ASTELIN) 0.1 % nasal spray Place 2 sprays into both nostrils 2 (two) times daily. Use in each nostril as directed 30 mL Nyoka Lint, PA-C      PDMP not reviewed this encounter.   Nyoka Lint, PA-C 02/09/23 1248

## 2023-02-25 ENCOUNTER — Other Ambulatory Visit (HOSPITAL_COMMUNITY): Payer: Self-pay

## 2023-03-21 ENCOUNTER — Telehealth: Payer: Self-pay | Admitting: Anesthesiology

## 2023-03-21 NOTE — Telephone Encounter (Signed)
Pt called stating she is having migraines and nausea. States when she called earlier she meant to say migraines and not headaches.

## 2023-03-21 NOTE — Telephone Encounter (Signed)
Ok for waitlist, thanks

## 2023-03-21 NOTE — Telephone Encounter (Signed)
Called patient to make her aware of the waitlist

## 2023-03-21 NOTE — Telephone Encounter (Signed)
Pt called stating she has been having headaches and a pain on top of her head that is very sensitive to touch. Requesting an earlier appt.

## 2023-03-31 ENCOUNTER — Encounter: Payer: Self-pay | Admitting: Neurology

## 2023-03-31 ENCOUNTER — Ambulatory Visit (INDEPENDENT_AMBULATORY_CARE_PROVIDER_SITE_OTHER): Payer: 59 | Admitting: Neurology

## 2023-03-31 VITALS — BP 137/73 | HR 65 | Ht 67.0 in | Wt 198.4 lb

## 2023-03-31 DIAGNOSIS — R519 Headache, unspecified: Secondary | ICD-10-CM

## 2023-03-31 MED ORDER — GABAPENTIN 300 MG PO CAPS: ORAL_CAPSULE | ORAL | 3 refills | Status: DC

## 2023-03-31 NOTE — Patient Instructions (Signed)
Good to see you.  Have bloodwork done for ESR, CRP  Schedule MRI brain with and without contrast  3. Increase Gabapentin 300mg : Take 1 capsule in AM, 3 capsules in PM  4. Follow-up as scheduled in October, call for any changes
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
## 2023-04-16 ENCOUNTER — Other Ambulatory Visit: Payer: Self-pay | Admitting: Family Medicine

## 2023-04-16 DIAGNOSIS — Z1231 Encounter for screening mammogram for malignant neoplasm of breast: Secondary | ICD-10-CM

## 2023-04-19 ENCOUNTER — Ambulatory Visit
Admission: RE | Admit: 2023-04-19 | Discharge: 2023-04-19 | Disposition: A | Discharge status: Home / Self Care | Payer: 59 | Source: Ambulatory Visit | Attending: Neurology | Admitting: Neurology

## 2023-04-19 DIAGNOSIS — R519 Headache, unspecified: Secondary | ICD-10-CM

## 2023-04-19 MED ORDER — GADOPICLENOL 0.5 MMOL/ML IV SOLN
10.0000 mL | Freq: Once | INTRAVENOUS | Status: AC | PRN
  Administered 2023-04-19: 10 mL via INTRAVENOUS

## 2023-04-21 ENCOUNTER — Encounter: Payer: Self-pay | Admitting: Neurology

## 2023-04-22 ENCOUNTER — Encounter (HOSPITAL_BASED_OUTPATIENT_CLINIC_OR_DEPARTMENT_OTHER): Payer: Self-pay | Admitting: Cardiovascular Disease

## 2023-04-22 MED ORDER — HYDRALAZINE HCL 25 MG PO TABS: 25.0000 mg | ORAL_TABLET | Freq: Two times a day (BID) | ORAL | 3 refills | Status: DC

## 2023-04-22 MED ORDER — DILTIAZEM HCL 120 MG PO TABS: 240.0000 mg | ORAL_TABLET | Freq: Two times a day (BID) | ORAL | 3 refills | Status: DC

## 2023-04-23 ENCOUNTER — Other Ambulatory Visit: Payer: 59

## 2023-04-23 DIAGNOSIS — R519 Headache, unspecified: Secondary | ICD-10-CM

## 2023-04-24 ENCOUNTER — Telehealth: Payer: Self-pay

## 2023-04-24 DIAGNOSIS — G40309 Generalized idiopathic epilepsy and epileptic syndromes, not intractable, without status epilepticus: Secondary | ICD-10-CM

## 2023-04-24 DIAGNOSIS — G43009 Migraine without aura, not intractable, without status migrainosus: Secondary | ICD-10-CM

## 2023-04-24 LAB — SEDIMENTATION RATE: Sed Rate: 84 mm/hr — ABNORMAL HIGH (ref 0–40)

## 2023-04-24 LAB — C-REACTIVE PROTEIN: CRP: 1 mg/L (ref 0–10)

## 2023-04-24 NOTE — Telephone Encounter (Signed)
Pt called informed that her bloodwork came back with one of the markers for inflammation a little elevated, but the other test was normal. I would like to repeat both ESR and CRP to confirm. Pt stated her mother passed away an she will come next week.

## 2023-04-24 NOTE — Telephone Encounter (Signed)
-----   Message from Van Clines, MD sent at 04/24/2023  8:08 AM EDT ----- Pls let her know that the bloodwork came back with one of the markers for inflammation a little elevated, but the other test was normal. I would like to repeat both ESR and CRP to confirm. Thanks

## 2023-05-05 ENCOUNTER — Other Ambulatory Visit (INDEPENDENT_AMBULATORY_CARE_PROVIDER_SITE_OTHER): Payer: 59

## 2023-05-05 DIAGNOSIS — G40309 Generalized idiopathic epilepsy and epileptic syndromes, not intractable, without status epilepticus: Secondary | ICD-10-CM | POA: Diagnosis not present

## 2023-05-05 DIAGNOSIS — G43009 Migraine without aura, not intractable, without status migrainosus: Secondary | ICD-10-CM

## 2023-05-06 LAB — C-REACTIVE PROTEIN: CRP: <1 mg/dL (ref 0.5–20.0)

## 2023-05-06 LAB — SEDIMENTATION RATE: Sed Rate: 77 mm/hr — ABNORMAL HIGH (ref 0–30)

## 2023-05-13 ENCOUNTER — Telehealth: Payer: Self-pay

## 2023-05-13 ENCOUNTER — Encounter: Payer: Self-pay | Admitting: Neurology

## 2023-05-13 ENCOUNTER — Other Ambulatory Visit: Payer: 59

## 2023-05-13 NOTE — Telephone Encounter (Signed)
-----   Message from Van Clines, MD sent at 05/12/2023  4:00 PM EDT ----- Pls let her know that the inflammatory markers show that the elevated marker has gone down a little, and the other one that was normal last time is still normal. Is she still having a lot of headaches? If yes, we would do a biopsy of the temporal artery on her temples to see if there is inflammation in the blood vessel. If she would like to proceed, pls send referral to [facility] surgery for bilateral temporal artery biopsy, thanks

## 2023-05-13 NOTE — Telephone Encounter (Signed)
Pt called and informed that inflammatory markers show that the elevated marker has gone down a little, and the other one that was normal last time is still normal. Is she still having a lot of headaches? Yes still having headaches. If yes, we would do a biopsy of the temporal artery on her temples to see if there is inflammation in the blood vessel. If she would like to proceed, pls send referral to [facility] surgery for bilateral temporal artery biopsy,  Pt would like for Dr Karel Jarvis to I've her more information on the Biopsy before she says yes or no to doing it,

## 2023-05-19 NOTE — Telephone Encounter (Signed)
Spoke to patient. Headaches have subsided, when she was talking to Herbert Seta she was under a lot of stress still grieving her mother's passing and sinuses were acting up. Her occasional migraines that are not debilitating still occur. We both agreed that at this point, no need for temporal artery biopsy.

## 2023-06-03 ENCOUNTER — Other Ambulatory Visit: Payer: Self-pay | Admitting: Neurology

## 2023-08-04 ENCOUNTER — Ambulatory Visit
Admission: RE | Admit: 2023-08-04 | Discharge: 2023-08-04 | Disposition: A | Discharge status: Home / Self Care | Payer: 59 | Source: Ambulatory Visit | Attending: Family Medicine | Admitting: Family Medicine

## 2023-08-04 DIAGNOSIS — Z1231 Encounter for screening mammogram for malignant neoplasm of breast: Secondary | ICD-10-CM

## 2023-08-28 ENCOUNTER — Telehealth: Payer: 59 | Admitting: Neurology

## 2023-09-03 ENCOUNTER — Encounter: Payer: Self-pay | Admitting: Neurology

## 2023-09-05 ENCOUNTER — Other Ambulatory Visit (HOSPITAL_COMMUNITY): Payer: Self-pay

## 2023-09-08 ENCOUNTER — Other Ambulatory Visit: Payer: Self-pay | Admitting: Neurology

## 2023-10-27 ENCOUNTER — Telehealth: Payer: 59 | Admitting: Neurology

## 2023-10-27 ENCOUNTER — Encounter: Payer: Self-pay | Admitting: Neurology

## 2023-10-27 DIAGNOSIS — G43009 Migraine without aura, not intractable, without status migrainosus: Secondary | ICD-10-CM

## 2023-10-27 DIAGNOSIS — G40309 Generalized idiopathic epilepsy and epileptic syndromes, not intractable, without status epilepticus: Secondary | ICD-10-CM

## 2023-10-27 DIAGNOSIS — G4485 Primary stabbing headache: Secondary | ICD-10-CM | POA: Diagnosis not present

## 2023-10-27 MED ORDER — GABAPENTIN 300 MG PO CAPS: ORAL_CAPSULE | ORAL | 3 refills | Status: DC

## 2023-10-27 MED ORDER — LAMOTRIGINE 200 MG PO TABS: 200.0000 mg | ORAL_TABLET | Freq: Two times a day (BID) | ORAL | 3 refills | Status: DC

## 2023-10-27 MED ORDER — BACLOFEN 20 MG PO TABS: 20.0000 mg | ORAL_TABLET | Freq: Three times a day (TID) | ORAL | 3 refills | Status: DC
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
## 2023-11-16 ENCOUNTER — Other Ambulatory Visit: Payer: Self-pay

## 2023-11-16 ENCOUNTER — Encounter (HOSPITAL_COMMUNITY): Payer: Self-pay | Admitting: Emergency Medicine

## 2023-11-16 ENCOUNTER — Emergency Department (HOSPITAL_COMMUNITY)
Admission: EM | Admit: 2023-11-16 | Discharge: 2023-11-17 | Discharge status: Against Medical Advice | Payer: 59 | Attending: Medical | Admitting: Medical

## 2023-11-16 ENCOUNTER — Emergency Department (HOSPITAL_COMMUNITY): Payer: 59

## 2023-11-16 DIAGNOSIS — S0990XA Unspecified injury of head, initial encounter: Secondary | ICD-10-CM | POA: Diagnosis present

## 2023-11-16 DIAGNOSIS — Z5321 Procedure and treatment not carried out due to patient leaving prior to being seen by health care provider: Secondary | ICD-10-CM | POA: Diagnosis not present

## 2023-11-16 DIAGNOSIS — W01198A Fall on same level from slipping, tripping and stumbling with subsequent striking against other object, initial encounter: Secondary | ICD-10-CM | POA: Diagnosis not present

## 2023-11-16 DIAGNOSIS — M542 Cervicalgia: Secondary | ICD-10-CM | POA: Diagnosis not present

## 2023-11-16 LAB — URINALYSIS, ROUTINE W REFLEX MICROSCOPIC
Bilirubin Urine: NEGATIVE
Glucose, UA: NEGATIVE mg/dL
Hgb urine dipstick: NEGATIVE
Ketones, ur: NEGATIVE mg/dL
Leukocytes,Ua: NEGATIVE
Nitrite: NEGATIVE
Protein, ur: NEGATIVE mg/dL
Specific Gravity, Urine: 1.011 (ref 1.005–1.030)
pH: 5 (ref 5.0–8.0)

## 2023-11-16 NOTE — ED Notes (Signed)
Pt stated she was leaving.  

## 2023-11-16 NOTE — ED Triage Notes (Signed)
Pt presents after mechanical fall after losing balance and landing on the back of her head.  Pt has significant hx of neck surgery so she placed her old brace on for stability and caution.  Headache and neck pain.

## 2023-11-16 NOTE — ED Provider Triage Note (Cosign Needed)
Emergency Medicine Provider Triage Evaluation Note  [name] [name] , a 57 y.o. female  was evaluated in triage.  Pt complains of falling and hitting the back of her head around 5PM. No LOC. Use of aspirin. Has old spinal cord injury per patient w/multiple old plates and screws in cervical region. C/o neck pain after falling, was not wearing old brace. Did not get dizzy prior to fall. Just lost balance per patient.  Review of Systems  Positive: Head injury Negative: Chest pain  Physical Exam  BP (!) 160/91   Pulse 84   Temp 98.3 F (36.8 C) (Oral)   Resp 18   SpO2 99%  Gen:   Awake, no distress   Resp:  Normal effort  MSK:   Moves extremities without difficulty  Other:    Medical Decision Making  Medically screening exam initiated at 7:42 PM.  Appropriate orders placed.  [name] [name] was informed that the remainder of the evaluation will be completed by another provider, this initial triage assessment does not replace that evaluation, and the importance of remaining in the ED until their evaluation is complete.     [name] [name], [place] 11/16/23 1943

## 2023-12-27 ENCOUNTER — Other Ambulatory Visit: Payer: Self-pay | Admitting: Neurology

## 2024-01-22 ENCOUNTER — Ambulatory Visit: Payer: 59 | Admitting: Pulmonary Disease

## 2024-02-02 ENCOUNTER — Other Ambulatory Visit: Payer: Self-pay | Admitting: Neurology

## 2024-02-05 ENCOUNTER — Ambulatory Visit: Admitting: Podiatry

## 2024-02-06 ENCOUNTER — Ambulatory Visit (INDEPENDENT_AMBULATORY_CARE_PROVIDER_SITE_OTHER): Admitting: Podiatry

## 2024-02-06 ENCOUNTER — Encounter: Payer: Self-pay | Admitting: Podiatry

## 2024-02-06 DIAGNOSIS — L84 Corns and callosities: Secondary | ICD-10-CM

## 2024-02-06 DIAGNOSIS — M205X1 Other deformities of toe(s) (acquired), right foot: Secondary | ICD-10-CM | POA: Diagnosis not present

## 2024-02-06 DIAGNOSIS — Z1589 Genetic susceptibility to other disease: Secondary | ICD-10-CM

## 2024-02-09 ENCOUNTER — Ambulatory Visit: Payer: 59 | Admitting: Pulmonary Disease

## 2024-02-09 NOTE — Progress Notes (Signed)
 Subjective:   Patient ID: [name] [name], female   DOB: 58 y.o.   MRN: [id number]   HPI Patient presents stating that she has heart problems with the big toe joint right is doing: She has got a spot on the outside of her left foot that is been inflamed and sore and somewhat hard to walk   Review of Systems  All other systems reviewed and are negative.       Objective:  Physical Exam Vitals and nursing note reviewed.  Constitutional:      Appearance: She is well-developed.  Pulmonary:     Effort: Pulmonary effort is normal.  Musculoskeletal:        General: Normal range of motion.  Skin:    General: Skin is warm.  Neurological:     Mental Status: She is alert.     Neurovascular status found to be intact muscle strength found to be adequate range of motion adequate with patient noted to have mildly broken range of motion first MPJ history of surgery number years ago which did well and inflammation of the left lateral foot     Assessment:  Hallux limitus deformity that has improved but still has some discomfort along with inflammatory condition     Plan:  H&P reviewed courtesy debridement advised on stretching exercises range of motion and rigid bottom shoes and reappoint on an as-needed basis

## 2024-02-18 ENCOUNTER — Telehealth (HOSPITAL_BASED_OUTPATIENT_CLINIC_OR_DEPARTMENT_OTHER): Payer: Self-pay | Admitting: Cardiovascular Disease

## 2024-02-18 DIAGNOSIS — R002 Palpitations: Secondary | ICD-10-CM

## 2024-02-18 DIAGNOSIS — I493 Ventricular premature depolarization: Secondary | ICD-10-CM

## 2024-02-18 NOTE — Telephone Encounter (Signed)
 Patient c/o Palpitations:  STAT if patient reporting lightheadedness, shortness of breath, or chest pain  How long have you had palpitations/irregular HR/ Afib? Are you having the symptoms now? Abnormal heart rythm  Are you currently experiencing lightheadedness, SOB or CP? Shortness of breath  Do you have a history of afib (atrial fibrillation) or irregular heart rhythm?   Have you checked your BP or HR? (document readings if available):   Are you experiencing any other symptoms? Dizziness- patient wants to be seen today

## 2024-02-18 NOTE — Telephone Encounter (Signed)
 Spoke with patient who stated her heart rhythm is not regular and started around 1 pm  She has a history of PVC's but that usually only lasts for about 5 min when she has them The irregular HR has been coming and going for about a week but today it has last  Does have shortness of breath when up moving around during these times  Yesterday blood pressure was 122/76 HR 64 today 144/83 HR 73. Blood pressure today taken during the time of irregular rhythm  Only drinks 1 cup of tea in the am and in the evening but has been doing this for a while When asked if feels like when she has had her PVC's in past stated it did earlier in week but doesn't today  Confirmed taking her diltiazem twice a day (6 am & 6 pm)   Will discuss with Dr Duke Salvia and call patient back, she is not in clinic today

## 2024-02-18 NOTE — Telephone Encounter (Signed)
 Dr Duke Salvia gone for day Discussed with Eula Fried NP. She recommended having patient take 1 Diltiazem 120 mg now and the other when at normal time later today and 7 day zio Reviewed recommendations and had patient scheduled for monitor placement tomorrow  Advised patient to go to ED if worsening symptoms, patient verbalized understanding

## 2024-02-19 ENCOUNTER — Other Ambulatory Visit: Payer: Self-pay | Admitting: *Deleted

## 2024-02-19 ENCOUNTER — Ambulatory Visit: Attending: Cardiovascular Disease

## 2024-02-19 DIAGNOSIS — I493 Ventricular premature depolarization: Secondary | ICD-10-CM

## 2024-02-19 DIAGNOSIS — R002 Palpitations: Secondary | ICD-10-CM

## 2024-02-19 NOTE — Progress Notes (Unsigned)
 ZIO serial # [id number] from office inventory applied to patient using tincture of benzoin as barrier for adhesive sensitivity.

## 2024-02-23 ENCOUNTER — Telehealth: Payer: Self-pay | Admitting: Cardiovascular Disease

## 2024-02-23 NOTE — Telephone Encounter (Signed)
 Pt states she's having allergic reaction to HR monitor. Please advise

## 2024-02-23 NOTE — Telephone Encounter (Signed)
 Returned call to patient,   Patient states she has an allergy to adhesive, however she is still reacting. She has had the monitor for about 4 days and is already starting to react. She states she can sleep without it bothering her, but when she is awake it really bothers. She states she had a good amount of palpitations since wearing it. Advised patient to remove the patch and send back, that we can see what the monitor picked up and then go from there!

## 2024-03-12 ENCOUNTER — Other Ambulatory Visit: Payer: Self-pay | Admitting: Neurology

## 2024-03-19 ENCOUNTER — Telehealth: Payer: Self-pay | Admitting: Cardiovascular Disease

## 2024-03-19 NOTE — Telephone Encounter (Signed)
  Pt is calling to follow up heart monitor result

## 2024-03-19 NOTE — Telephone Encounter (Signed)
 Called and spoke to pt DOB and last name verified. Results discussed with pt. Attempted to schedule pt; pt would like to be placed on cancellation list for TR. Pt verbalized understanding. Will call when spot opens within MD's schedule.  Monitor with average heart rate 69 bpm. PVC burden <1%. No significant arrhythmias. Triggered episodes associated with normal sinus rhythm most often with heart rate in the 60s. No abnormalities that would cause palpitations. Recommend continue current medications and schedule overdue follow up (last seen 04/29/22 recommended for 6 months follow up).    Caitlin S Walker, NP

## 2024-03-19 NOTE — Telephone Encounter (Signed)
 Monitor with average heart rate 69 bpm. PVC burden <1%. No significant arrhythmias. Triggered episodes associated with normal sinus rhythm most often with heart rate in the 60s. No abnormalities that would cause palpitations. Recommend continue current medications and schedule overdue follow up (last seen 04/29/22 recommended for 6 months follow up).   Kimberla Driskill S Ashten Sarnowski, NP

## 2024-04-06 ENCOUNTER — Ambulatory Visit: Admitting: Pulmonary Disease

## 2024-04-11 ENCOUNTER — Other Ambulatory Visit: Payer: Self-pay | Admitting: Neurology

## 2024-04-15 ENCOUNTER — Ambulatory Visit: Admitting: Neurology

## 2024-04-15 NOTE — Telephone Encounter (Signed)
 Spoke with patient today, she is requesting a copy of her monitor results to be mailed to her, said she had indicated in last conversation when the results were told to her. Please advise.

## 2024-04-20 ENCOUNTER — Ambulatory Visit: Payer: Self-pay | Admitting: Cardiovascular Disease

## 2024-04-20 DIAGNOSIS — R002 Palpitations: Secondary | ICD-10-CM | POA: Diagnosis not present

## 2024-04-20 DIAGNOSIS — I493 Ventricular premature depolarization: Secondary | ICD-10-CM

## 2024-04-28 ENCOUNTER — Encounter (HOSPITAL_BASED_OUTPATIENT_CLINIC_OR_DEPARTMENT_OTHER): Payer: Self-pay | Admitting: Cardiovascular Disease

## 2024-05-13 ENCOUNTER — Other Ambulatory Visit: Payer: Self-pay

## 2024-05-13 MED ORDER — GABAPENTIN 300 MG PO CAPS: ORAL_CAPSULE | ORAL | 1 refills | Status: DC

## 2024-05-13 MED ORDER — HYDRALAZINE HCL 25 MG PO TABS: 25.0000 mg | ORAL_TABLET | Freq: Two times a day (BID) | ORAL | 0 refills | Status: DC

## 2024-05-24 ENCOUNTER — Other Ambulatory Visit: Payer: Self-pay

## 2024-05-24 MED ORDER — DILTIAZEM HCL 120 MG PO TABS: 240.0000 mg | ORAL_TABLET | Freq: Two times a day (BID) | ORAL | 3 refills | Status: DC

## 2024-05-29 ENCOUNTER — Other Ambulatory Visit: Payer: Self-pay | Admitting: Cardiovascular Disease

## 2024-06-03 ENCOUNTER — Encounter (HOSPITAL_BASED_OUTPATIENT_CLINIC_OR_DEPARTMENT_OTHER): Payer: Self-pay | Admitting: Cardiovascular Disease

## 2024-06-03 ENCOUNTER — Other Ambulatory Visit (HOSPITAL_BASED_OUTPATIENT_CLINIC_OR_DEPARTMENT_OTHER): Payer: Self-pay | Admitting: *Deleted

## 2024-06-07 ENCOUNTER — Telehealth: Payer: Self-pay

## 2024-06-07 DIAGNOSIS — G4733 Obstructive sleep apnea (adult) (pediatric): Secondary | ICD-10-CM

## 2024-06-07 NOTE — Telephone Encounter (Unsigned)
 Copied from CRM [id number]. Topic: Clinical - Order For Equipment >> Jun 03, 2024  4:48 PM Celestine FALCON wrote: Reason for CRM: Pt is requesting Dr. Neda to send in a new prescription for CPAP machine as the patient's CPAP machine has stopped working entirely. She stated she was told she may be eligible for a replacement as soon as August 2025. Pt stated the supplier of the CPAP is [facility]. Pt's phone number is [phone] ok to leave a vm.  ATC x1 Left a detailed message to let patient know I have sent in a new order for her CPAP . Nothing else further needed.

## 2024-06-09 NOTE — Telephone Encounter (Signed)
 Yes, please place CPAP order  On Auto CPAP 5-15

## 2024-06-09 NOTE — Telephone Encounter (Signed)
 Patient is calling back concerning needing a new cpap machine ./ relayed to patient that the doctor is going to place a order . She understood and said ok

## 2024-06-14 ENCOUNTER — Telehealth: Payer: Self-pay | Admitting: Pulmonary Disease

## 2024-06-14 DIAGNOSIS — G4733 Obstructive sleep apnea (adult) (pediatric): Secondary | ICD-10-CM

## 2024-06-14 NOTE — Telephone Encounter (Signed)
 Just an FYI  Per Ky at [facility] Patient isn't eligible for new machine until 06/30/2024. We will process order at that time. Thanks!

## 2024-06-23 NOTE — Procedures (Signed)
Mask fit

## 2024-06-24 ENCOUNTER — Ambulatory Visit (INDEPENDENT_AMBULATORY_CARE_PROVIDER_SITE_OTHER): Admitting: Pulmonary Disease

## 2024-06-24 VITALS — BP 127/76 | Ht 67.0 in | Wt 185.0 lb

## 2024-06-24 DIAGNOSIS — G4733 Obstructive sleep apnea (adult) (pediatric): Secondary | ICD-10-CM

## 2024-06-24 NOTE — Progress Notes (Signed)
 Subjective:     Patient ID: [name] [name], female   DOB: 05/06/1966, 57 y.o.   MRN: [id number]  Patient is being seen for obstructive sleep apnea  She continues to try to use her CPAP  Tolerating CPAP okay She still snores on the back  Having some issues with the CPAP mask  Occasional daytime sleepiness  Does have a lot of pain and discomfort in the back and spine  She has an order for new CPAP in place  Usual bedtime about 130, out of bed at 6 AM and then may go back to bed for about an hour to  No dryness of the mouth in the mornings Memory is intact No family history of obstructive sleep apnea  History of chronic neck pain, back surgery in the past       Review of Systems  Eyes: Negative.   Respiratory:  Positive for apnea.   Gastrointestinal: Negative.   Endocrine: Negative.   Genitourinary: Negative.   Psychiatric/Behavioral:  Positive for sleep disturbance.    Past Medical History:  Diagnosis Date   Aortic atherosclerosis (HCC)    Aortic atherosclerosis (HCC) 05/01/2021   Chronic headaches    CKD (chronic kidney disease), stage II    Complex partial seizures (HCC)    Dyspnea 12/24/2006   B/P MV - normal perfusion in all regions; post stress LV normal in size; EF 63%; no significant ischemia noted   Hyperlipidemia    Hypertension    Palpitations 01/01/2007   30 day event monitor - 69 pt reported events- some PAC and PVCs reported   PVC's (premature ventricular contractions)    Sleep apnea    SOB (shortness of breath) 04/20/2012   MET test - exercise limiting factor - chronotropic incompetence (medication), peak VO2 76% of predicted; no EKG ST changes noted at sub-optimal peak HR   Social History   Socioeconomic History   Marital status: Married    Spouse name: Not on file   Number of children: Not on file   Years of education: Not on file   Highest education level: Not on file  Occupational History   Not on file  Tobacco Use    Smoking status: Never   Smokeless tobacco: Never  Vaping Use   Vaping status: Never Used  Substance and Sexual Activity   Alcohol use: No   Drug use: No   Sexual activity: Not on file  Other Topics Concern   Not on file  Social History Narrative   Pt is R handed   Lives in 2 story home with her husband, Tonette.   Has 1 biological child / 1 step child   Associated degree in nursing   Disabled   Caffeine 16 0z daily   Social Drivers of Health   Financial Resource Strain: Not on file  Food Insecurity: Low Risk  (11/26/2023)   Received from [facility]   Hunger Vital Sign    Within the past 12 months, you worried that your food would run out before you got money to buy more: Never true    Within the past 12 months, the food you bought just didn't last and you didn't have money to get more. : Never true  Transportation Needs: No Transportation Needs (11/26/2023)   Received from [facility]    In the past 12 months, has lack of reliable transportation kept you from medical appointments, meetings, work or from getting things needed for daily living? : No  Physical Activity: Not on file  Stress: Not on file  Social Connections: Not on file  Intimate Partner Violence: Not on file   Family History  Problem Relation Age of Onset   Diabetes Mother    Hypertension Mother    Breast cancer Mother 26   Asthma Brother    Healthy Brother        Objective:   Physical Exam Constitutional:      Appearance: Normal appearance.  HENT:     Head: Normocephalic.     Nose: No congestion.  Eyes:     General: No scleral icterus. Cardiovascular:     Rate and Rhythm: Normal rate and regular rhythm.     Pulses: Normal pulses.     Heart sounds: Normal heart sounds. No murmur heard.    No friction rub.  Pulmonary:     Effort: Pulmonary effort is normal. No respiratory distress.     Breath sounds: Normal breath sounds. No stridor. No wheezing or rhonchi.  Musculoskeletal:      Cervical back: No muscular tenderness.  Neurological:     Mental Status: She is alert.  Psychiatric:        Mood and Affect: Mood normal.    Vitals:   06/24/24 1604  BP: 127/76  SpO2: 98%      11/28/2022    3:00 PM 05/24/2019    4:00 PM  Results of the Epworth flowsheet  Sitting and reading 1 3  Watching TV 1 3  Sitting, inactive in a public place (e.g. a theatre or a meeting) 0 1  As a passenger in a car for an hour without a break 2 3  Lying down to rest in the afternoon when circumstances permit 0 2  Sitting and talking to someone 0 0  Sitting quietly after a lunch without alcohol 0 3  In a car, while stopped for a few minutes in traffic 0 2  Total score 4 17     Recent sleep study reviewed by myself Compliance data reveals excellent compliance with AHI below 2.7  Most recent compliance 02/29/2024 to 05/28/2024 Average use of 6 hours 19 minutes 5-15 Residual AHI of 2.9  Assessment:     Severe obstructive sleep apnea Compliant with CPAP - Residual AHI of 2.9  She has an order for new machine in place  Overall feels well  Sleep quality is good     Plan:     Encouraged to continue CPAP  Start new machine once received we will see her within about 3 months  Call with significant concerns  Graded activities as tolerated

## 2024-06-24 NOTE — Patient Instructions (Signed)
 I will see you back in about 3 months  Hopefully you will be getting your new machine soon  Start using the machine on a regular basis and call us  with any significant problems  I will see you back in about 3 months  Graded activities as tolerated  Sleeping on your side helps your number of events

## 2024-06-25 ENCOUNTER — Encounter: Payer: Self-pay | Admitting: Pulmonary Disease

## 2024-06-25 NOTE — Telephone Encounter (Signed)
 FYI

## 2024-06-28 ENCOUNTER — Other Ambulatory Visit: Payer: Self-pay | Admitting: Family Medicine

## 2024-06-28 DIAGNOSIS — Z1231 Encounter for screening mammogram for malignant neoplasm of breast: Secondary | ICD-10-CM

## 2024-07-16 ENCOUNTER — Encounter (HOSPITAL_COMMUNITY): Payer: Self-pay | Admitting: *Deleted

## 2024-07-16 ENCOUNTER — Ambulatory Visit (HOSPITAL_COMMUNITY)
Admission: EM | Admit: 2024-07-16 | Discharge: 2024-07-16 | Disposition: A | Discharge status: Home / Self Care | Attending: Family Medicine | Admitting: Family Medicine

## 2024-07-16 DIAGNOSIS — R82998 Other abnormal findings in urine: Secondary | ICD-10-CM | POA: Diagnosis not present

## 2024-07-16 DIAGNOSIS — M5442 Lumbago with sciatica, left side: Secondary | ICD-10-CM

## 2024-07-16 DIAGNOSIS — M5441 Lumbago with sciatica, right side: Secondary | ICD-10-CM

## 2024-07-16 DIAGNOSIS — G8929 Other chronic pain: Secondary | ICD-10-CM

## 2024-07-16 LAB — POCT URINALYSIS DIP (MANUAL ENTRY)
Bilirubin, UA: NEGATIVE
Blood, UA: NEGATIVE
Glucose, UA: NEGATIVE mg/dL
Ketones, POC UA: NEGATIVE mg/dL
Nitrite, UA: NEGATIVE
Spec Grav, UA: 1.015 (ref 1.010–1.025)
Urobilinogen, UA: 0.2 U/dL
pH, UA: 6 (ref 5.0–8.0)

## 2024-07-16 MED ORDER — CEPHALEXIN 500 MG PO CAPS: 500.0000 mg | ORAL_CAPSULE | Freq: Two times a day (BID) | ORAL | 0 refills | Status: AC

## 2024-07-16 MED ORDER — METHYLPREDNISOLONE 4 MG PO TBPK: ORAL_TABLET | ORAL | 0 refills | Status: AC

## 2024-07-16 NOTE — ED Triage Notes (Signed)
 States started with her normal back pain to low back 2 days ago, with progressive worsening. C/o spasms and sharp pains, and now having sharp pains to left lateral abd. Pain worse with movements and certain position changes. Had some nausea yesterday, which has resolved. Denies fevers, denies any urinary sxs. Pt is already taking Baclofen  and gabapentin .

## 2024-07-16 NOTE — ED Provider Notes (Addendum)
 [facility]    CSN: [id number] Arrival date & time: 07/16/24  1236      History   Chief Complaint Chief Complaint  Patient presents with   Back Pain   Abdominal Pain    HPI [name] [name] is a 58 y.o. female with a complex medical history listed below who presents for back pain.  Patient reports that she has chronic low back pain and over the past 2 days seems to have worsened.  States sensation is throbbing spasming bilateral lower back pain that does not radiate.  It is worse with movement.  Denies any numbness/tingling/weakness of her lower extremities, no bowel or bladder incontinence, no saddle paresthesia.  No dysuria.  She does have a history of surgery on her neck but denies any low back surgeries.  She already takes baclofen  and gabapentin  regularly.  She took some Tylenol  without improvement.  No other concerns at this time.   Back Pain Abdominal Pain   Past Medical History:  Diagnosis Date   Aortic atherosclerosis (HCC)    Aortic atherosclerosis (HCC) 05/01/2021   Chronic headaches    CKD (chronic kidney disease), stage II    Complex partial seizures (HCC)    Dyspnea 12/24/2006   B/P MV - normal perfusion in all regions; post stress LV normal in size; EF 63%; no significant ischemia noted   Hyperlipidemia    Hypertension    Palpitations 01/01/2007   30 day event monitor - 69 pt reported events- some PAC and PVCs reported   PVC's (premature ventricular contractions)    Sleep apnea    SOB (shortness of breath) 04/20/2012   MET test - exercise limiting factor - chronotropic incompetence (medication), peak VO2 76% of predicted; no EKG ST changes noted at sub-optimal peak HR    Patient Active Problem List   Diagnosis Date Noted   Aortic atherosclerosis (HCC) 05/01/2021   Anemia due to stage 3a chronic kidney disease (HCC) 04/18/2020   Nontraumatic complete tear of right rotator cuff 03/27/2020   Leg swelling 06/17/2019   Tinnitus of both  ears 01/11/2019   Lateral epicondylitis of left elbow 08/18/2018   Bulge of cervical disc without myelopathy 01/15/2018   Breast pain, left 12/18/2017   Breast tenderness in female 12/18/2017   Urinary incontinence without sensory awareness 12/18/2017   Hoarseness 09/16/2017   Chronic cough 05/13/2017   Gastroesophageal reflux disease with esophagitis 05/13/2017   Hemoptysis 05/13/2017   Phlegm in throat 12/18/2016   Other dysphagia 12/18/2016   Metabolic bone disease 07/10/2016   Vitamin D deficiency 07/10/2016   Bilateral occipital neuralgia 04/09/2016   Cervical radiculopathy 04/09/2016   Common migraine 04/09/2016   Complex partial seizure evolving to generalized seizure (HCC) 04/09/2016   Low back pain 04/09/2016   Lumbar radiculopathy 04/09/2016   Neck pain 04/09/2016   Vertigo 04/09/2016   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HCC) 04/09/2016   Pain in joint involving multiple sites 04/09/2016   OSA (obstructive sleep apnea) 04/09/2016   Rotator cuff syndrome of left shoulder 01/26/2016   Patellofemoral arthralgia of right knee 01/26/2016   Benign hypertension with chronic kidney disease, stage II 01/16/2016   NSAID long-term use 01/16/2016   Chest pain 06/28/2015   Palpitations 06/28/2015   Intermittent palpitations 06/28/2015   Irregular heart rate 07/06/2013   PVC's (premature ventricular contractions) 07/06/2013   Essential hypertension 07/06/2013    Past Surgical History:  Procedure Laterality Date   ABDOMINAL HYSTERECTOMY  02/2003   ACD  04/24/2010  Done at [facility] --C2-3 and C6 and C7   ACDF N/A 05/01/2007   Done at [facility] --C5 to C6   BILATERAL OOPHORECTOMY  05/2004   CERVICAL DISC SURGERY  12/01/2002   C4-C5 fusion and bone graft   COLONOSCOPY WITH PROPOFOL  N/A 12/20/2022   Procedure: COLONOSCOPY WITH PROPOFOL ;  Surgeon: Rollin Dover, MD;  Location: [facility];  Service: Gastroenterology;  Laterality: N/A;   FOOT SURGERY     FOOT SURGERY Right     OB  History   No obstetric history on file.      Home Medications    Prior to Admission medications   Medication Sig Start Date End Date Taking? Authorizing Provider  aspirin 81 MG tablet Take 81 mg by mouth daily.   Yes [provider]  baclofen  (LIORESAL ) 20 MG tablet TAKE 1 TABLET BY MOUTH 3 TIMES  DAILY 03/15/24  Yes Georjean Darice HERO, MD  cephALEXin  (KEFLEX ) 500 MG capsule Take 1 capsule (500 mg total) by mouth 2 (two) times daily for 7 days. 07/16/24 07/23/24 Yes Kollyn Lingafelter, Jodi R, NP  diltiazem  (CARDIZEM ) 120 MG tablet TAKE 2 TABLETS BY MOUTH TWICE  DAILY 05/31/24  Yes Raford Riggs, MD  docusate sodium (COLACE) 100 MG capsule Take 100 mg by mouth 2 (two) times daily.   Yes [provider]  furosemide (LASIX) 20 MG tablet Take 20 mg by mouth 3 (three) times a week.   Yes [provider]  gabapentin  (NEURONTIN ) 300 MG capsule TAKE 1 CAPSULE BY MOUTH IN  THE MORNING AND 3  CAPSULES BY MOUTH IN THE  EVENING 05/13/24  Yes Georjean Darice HERO, MD  hydrALAZINE  (APRESOLINE ) 25 MG tablet TAKE 1 TABLET BY MOUTH TWICE  DAILY 05/31/24  Yes Raford Riggs, MD  lamoTRIgine  (LAMICTAL ) 200 MG tablet TAKE 1 TABLET BY MOUTH TWICE  DAILY 04/13/24  Yes Georjean Darice HERO, MD  methylPREDNISolone  (MEDROL  DOSEPAK) 4 MG TBPK tablet Take as prescribed on package 07/16/24  Yes Tieshia Rettinger, Jodi R, NP  Multiple Vitamins-Minerals (MULTIVITAMIN-MINERALS PO) Take by mouth.   Yes [provider]  NON FORMULARY Take 1,000 mg by mouth daily. [facility] Vitamin D   Yes [provider]  omeprazole (PRILOSEC OTC) 20 MG tablet Take 20 mg by mouth daily.   Yes [provider]  acetaminophen  (TYLENOL ) 500 MG tablet Take 1,000 mg by mouth every 6 (six) hours as needed for pain (pain).    [provider]    Family History Family History  Problem Relation Age of Onset   Diabetes Mother    Hypertension Mother    Breast cancer Mother 70   Asthma Brother    Healthy Brother     Social  History Social History   Tobacco Use   Smoking status: Never   Smokeless tobacco: Never  Vaping Use   Vaping status: Never Used  Substance Use Topics   Alcohol use: No   Drug use: No     Allergies   Camphor-menthol, Fish allergy, Fish-derived products, Sarna sensitive, Wheat, Adhesive [tape], Sulfa antibiotics, Sulfacetamide sodium, Sulfasalazine, Halog [halcinonide], Other, Ketoconazole, and Triamcinolone    Review of Systems Review of Systems  Musculoskeletal:  Positive for back pain.     Physical Exam Triage Vital Signs ED Triage Vitals [07/16/24 1247]  Encounter Vitals Group     BP (!) 160/81     Girls Systolic BP Percentile      Girls Diastolic BP Percentile      Boys Systolic BP Percentile  Boys Diastolic BP Percentile      Pulse Rate 75     Resp 18     Temp 98.4 F (36.9 C)     Temp Source Oral     SpO2 96 %     Weight      Height      Head Circumference      Peak Flow      Pain Score 8     Pain Loc      Pain Education      Exclude from Growth Chart    No data found.  Updated Vital Signs BP (!) 160/81   Pulse 75   Temp 98.4 F (36.9 C) (Oral)   Resp 18   SpO2 96%   Visual Acuity Right Eye Distance:   Left Eye Distance:   Bilateral Distance:    Right Eye Near:   Left Eye Near:    Bilateral Near:     Physical Exam Vitals and nursing note reviewed.  Constitutional:      General: She is not in acute distress.    Appearance: Normal appearance. She is not ill-appearing.  HENT:     Head: Normocephalic and atraumatic.  Eyes:     Pupils: Pupils are equal, round, and reactive to light.  Cardiovascular:     Rate and Rhythm: Normal rate.  Pulmonary:     Effort: Pulmonary effort is normal.  Musculoskeletal:     Thoracic back: No tenderness or bony tenderness.     Lumbar back: Spasms and tenderness present. No swelling, edema, deformity, signs of trauma, lacerations or bony tenderness. Normal range of motion. Positive right straight leg  raise test and positive left straight leg raise test. No scoliosis.       Back:     Comments: Strength is 5 out of 5 bilateral lower extremities  Skin:    General: Skin is warm and dry.  Neurological:     General: No focal deficit present.     Mental Status: She is alert and oriented to person, place, and time.  Psychiatric:        Mood and Affect: Mood normal.        Behavior: Behavior normal.      UC Treatments / Results  Labs (all labs ordered are listed, but only abnormal results are displayed) Labs Reviewed  POCT URINALYSIS DIP (MANUAL ENTRY) - Abnormal; Notable for the following components:      Result Value   Clarity, UA cloudy (*)    Protein Ur, POC trace (*)    Leukocytes, UA Small (1+) (*)    All other components within normal limits  URINE CULTURE    EKG   Radiology No results found.  Procedures Procedures (including critical care time)  Medications Ordered in UC Medications - No data to display  Initial Impression / Assessment and Plan / UC Course  I have reviewed the triage vital signs and the nursing notes.  Pertinent labs & imaging results that were available during my care of the patient were reviewed by me and considered in my medical decision making (see chart for details).     Reviewed exam and symptoms with patient.  Urine with small leuks.  Will send urine culture and start Keflex  twice daily for 7 days.  Discussed acute on chronic low back pain.  She reports she is unable to take NSAIDs due to her kidney function.  She is already on a muscle relaxer and gabapentin .  Will  do Medrol  Dosepak.  No Lidoderm patch given allergy to Sarna lotion with anaphylaxis as these have a common ingredient.  Advise heat to the back lots of rest.  PCP follow-up 2 days for recheck.  ER precautions reviewed and patient verbalized understanding. Final Clinical Impressions(s) / UC Diagnoses   Final diagnoses:  Chronic bilateral low back pain with bilateral sciatica   Leukocytes in urine     Discharge Instructions      The clinic will contact you with results of the urine culture done today if positive.  Start Keflex  twice daily for 7 days.  Continue your baclofen  and gabapentin  as previously prescribed.  You may start the Medrol  Dosepak as prescribed.  Heat to the back and lots of rest.  Please follow-up with your PCP in 2 to 3 days for recheck.  Please go to the ER for any worsening symptoms.  Hope you feel better soon!    ED Prescriptions     Medication Sig Dispense Auth. Provider   methylPREDNISolone  (MEDROL  DOSEPAK) 4 MG TBPK tablet Take as prescribed on package 21 tablet Fin Hupp, Jodi R, NP   cephALEXin  (KEFLEX ) 500 MG capsule Take 1 capsule (500 mg total) by mouth 2 (two) times daily for 7 days. 14 capsule Kaydan Wong, Jodi R, NP      PDMP not reviewed this encounter.   Loreda Myla SAUNDERS, NP 07/16/24 1339    Loreda Myla SAUNDERS, NP 07/16/24 1340

## 2024-07-16 NOTE — Discharge Instructions (Addendum)
 The clinic will contact you with results of the urine culture done today if positive.  Start Keflex  twice daily for 7 days.  Continue your baclofen  and gabapentin  as previously prescribed.  You may start the Medrol  Dosepak as prescribed.  Heat to the back and lots of rest.  Please follow-up with your PCP in 2 to 3 days for recheck.  Please go to the ER for any worsening symptoms.  Hope you feel better soon!

## 2024-07-18 LAB — URINE CULTURE

## 2024-07-19 ENCOUNTER — Ambulatory Visit: Payer: Self-pay | Admitting: Nurse Practitioner

## 2024-07-20 NOTE — Telephone Encounter (Signed)
-----   Message from Myla JONELLE Bold sent at 07/19/2024  8:10 AM EDT ----- Please let patient know she needs to give another sample. Saw her at [address] so she can go there  ----- Message ----- From: Rebecka, [facility] [place] Sent: 07/17/2024  12:17 PM EDT To: Jodi R Mayer, NP

## 2024-07-21 ENCOUNTER — Telehealth: Payer: Self-pay | Admitting: Pulmonary Disease

## 2024-07-21 DIAGNOSIS — G4733 Obstructive sleep apnea (adult) (pediatric): Secondary | ICD-10-CM

## 2024-07-21 NOTE — Telephone Encounter (Signed)
 I placed new order with settings  Routing back to [facility] as RICK Hun to sign since Dr Neda not in clinic today

## 2024-07-21 NOTE — Telephone Encounter (Signed)
 Copied from CRM #[id number]. Topic: General - Other >> Jul 21, 2024 11:41 AM Shona S wrote: Reason for CRM: patient is calling because she was supposed to receive her cpap machine but [facility] did not cal her to let her know what was the delay, she ended up calling them and they told her they only received the order and not the doctor notes. [facility] needs a few other things submitted so patient can receive cpap machine, she is a patient of dr neda, please call and update patient once done.

## 2024-07-21 NOTE — Telephone Encounter (Signed)
 Please refer to other telephone encounter dated 07/21/24   I called and spoke with the pt and explained to her that the order needed to be placed again  I placed order to include CPAP settings based on her last visit  Beth is going to sign order since Dr. Neda is not in clinic today  Nothing further needed

## 2024-07-21 NOTE — Telephone Encounter (Signed)
 Per Terri at [facility]- Good afternoon. I am working on the CPAP order for this patient. Since she was seen on 06/24/24 I will need the provider to send an updated order as the one that we have was written before the patient was seen and we will be needing the pressure settings on the Rx as well.

## 2024-08-09 ENCOUNTER — Ambulatory Visit

## 2024-08-26 ENCOUNTER — Other Ambulatory Visit: Payer: Self-pay | Admitting: Family Medicine

## 2024-08-26 ENCOUNTER — Encounter: Payer: Self-pay | Admitting: Pulmonary Disease

## 2024-08-26 DIAGNOSIS — N644 Mastodynia: Secondary | ICD-10-CM

## 2024-08-30 ENCOUNTER — Ambulatory Visit

## 2024-09-08 NOTE — Telephone Encounter (Signed)
 Copied from CRM [id number]. Topic: Appointments - Scheduling Inquiry for Clinic >> Sep 08, 2024  2:48 PM [name] [name] wrote: Reason for CRM: Patient states she has not started using her CPAP machine yet but will be getting it set up tomorrow and would like to know if Dr. Neda would like her to push back her appointment on 11/ 10 - please call patient back to advise.  Called patient,will be starting it tomorrow,set appointment for Nov 26 to meet the 31-90 days of data.NFN

## 2024-09-13 ENCOUNTER — Ambulatory Visit
Admission: RE | Admit: 2024-09-13 | Discharge: 2024-09-13 | Disposition: A | Source: Ambulatory Visit | Attending: Family Medicine

## 2024-09-13 ENCOUNTER — Ambulatory Visit
Admission: RE | Admit: 2024-09-13 | Discharge: 2024-09-13 | Disposition: A | Discharge status: Home / Self Care | Source: Ambulatory Visit | Attending: Family Medicine | Admitting: Family Medicine

## 2024-09-13 DIAGNOSIS — N644 Mastodynia: Secondary | ICD-10-CM

## 2024-09-20 ENCOUNTER — Ambulatory Visit (HOSPITAL_BASED_OUTPATIENT_CLINIC_OR_DEPARTMENT_OTHER): Admitting: Cardiovascular Disease

## 2024-09-24 ENCOUNTER — Ambulatory Visit (HOSPITAL_BASED_OUTPATIENT_CLINIC_OR_DEPARTMENT_OTHER): Admitting: Cardiovascular Disease

## 2024-09-27 ENCOUNTER — Ambulatory Visit: Admitting: Pulmonary Disease

## 2024-10-13 ENCOUNTER — Ambulatory Visit: Admitting: Pulmonary Disease

## 2024-10-21 ENCOUNTER — Other Ambulatory Visit: Payer: Self-pay | Admitting: Neurology

## 2024-10-22 MED ORDER — LAMOTRIGINE 200 MG PO TABS: 200.0000 mg | ORAL_TABLET | Freq: Two times a day (BID) | ORAL | 3 refills | Status: DC

## 2024-10-22 NOTE — Addendum Note (Signed)
 Addended by: GEORJEAN DARICE HERO on: 10/22/2024 08:46 AM   Modules accepted: Orders

## 2024-11-08 ENCOUNTER — Encounter: Payer: Self-pay | Admitting: Neurology

## 2024-11-08 ENCOUNTER — Ambulatory Visit: Admitting: Neurology

## 2024-11-08 VITALS — BP 134/83 | HR 70 | Ht 67.0 in | Wt 190.8 lb

## 2024-11-08 DIAGNOSIS — G43009 Migraine without aura, not intractable, without status migrainosus: Secondary | ICD-10-CM

## 2024-11-08 DIAGNOSIS — G40309 Generalized idiopathic epilepsy and epileptic syndromes, not intractable, without status epilepticus: Secondary | ICD-10-CM | POA: Diagnosis not present

## 2024-11-08 DIAGNOSIS — M5416 Radiculopathy, lumbar region: Secondary | ICD-10-CM | POA: Diagnosis not present

## 2024-11-08 MED ORDER — LAMOTRIGINE 200 MG PO TABS: 200.0000 mg | ORAL_TABLET | Freq: Two times a day (BID) | ORAL | 4 refills | Status: AC

## 2024-11-08 MED ORDER — GABAPENTIN 300 MG PO CAPS: ORAL_CAPSULE | ORAL | 4 refills | Status: AC
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
## 2024-11-08 NOTE — Patient Instructions (Signed)
 It's always good to see you. Continue all your medications. Follow-up in 1 year, call for any changes.    Seizure Precautions: 1. If medication has been prescribed for you to prevent seizures, take it exactly as directed.  Do not stop taking the medicine without talking to your doctor first, even if you have not had a seizure in a long time.   2. Avoid activities in which a seizure would cause danger to yourself or to others.  Don't operate dangerous machinery, swim alone, or climb in high or dangerous places, such as on ladders, roofs, or girders.  Do not drive unless your doctor says you may.  3. If you have any warning that you may have a seizure, lay down in a safe place where you can't hurt yourself.    4.  No driving for 6 months from last seizure, as per [place]  state law.   Please refer to the following link on the Epilepsy Foundation of America's website for more information: http://www.epilepsyfoundation.org/answerplace/Social/driving/drivingu.cfm   5.  Maintain good sleep hygiene. Avoid alcohol.  6.  Contact your doctor if you have any problems that may be related to the medicine you are taking.  7.  Call 911 and bring the patient back to the ED if:        A.  The seizure lasts longer than 5 minutes.       B.  The patient doesn't awaken shortly after the seizure  C.  The patient has new problems such as difficulty seeing, speaking or moving  D.  The patient was injured during the seizure  E.  The patient has a temperature over 102 F (39C)  F.  The patient vomited and now is having trouble breathing

## 2024-11-09 MED ORDER — BACLOFEN 20 MG PO TABS: 20.0000 mg | ORAL_TABLET | Freq: Three times a day (TID) | ORAL | 4 refills | Status: AC

## 2024-11-15 ENCOUNTER — Encounter: Payer: Self-pay | Admitting: Neurology

## 2024-11-16 ENCOUNTER — Encounter: Payer: Self-pay | Admitting: Pulmonary Disease

## 2024-11-16 NOTE — Telephone Encounter (Signed)
 Dr. MALVA, please advise on below mychart msg from pt   Thanks!  Hi Dr. Neda, I have been having some issues since I started using my new machine.  My mask is irritating my skin so bad that it is causing small cuts on the left side of my mouth. The right side has an area where it stings. When I remove my mask, the imprint is on the entire area. The white area would go away as soon as I wash my face, and the imprint would eventually subside. This is no longer happening.

## 2024-11-29 ENCOUNTER — Encounter: Payer: Self-pay | Admitting: Pulmonary Disease

## 2024-11-29 ENCOUNTER — Ambulatory Visit (INDEPENDENT_AMBULATORY_CARE_PROVIDER_SITE_OTHER): Admitting: Pulmonary Disease

## 2024-11-29 VITALS — BP 132/74 | HR 74 | Temp 97.5°F | Ht 67.0 in | Wt 188.6 lb

## 2024-11-29 DIAGNOSIS — G4733 Obstructive sleep apnea (adult) (pediatric): Secondary | ICD-10-CM | POA: Diagnosis not present

## 2024-11-29 NOTE — Progress Notes (Addendum)
 " Subjective:     Patient ID: [name] [name], female   DOB: 08/28/1966, 59 y.o.   MRN: [id number]  Patient in for follow-up for obstructive sleep apnea  She has been having some mask issues  Currently using a fullface mask and developing facial rash She had contacted the office and had recommended mask liners to be used with a mask She was finally able to procure liners  She does have areas of her face that remains sore Lives Her face is well  She feels she benefits from CPAP and will continue to use CPAP she is just on find some relief around the facial irritation  Bedtime is unchanged Wakes up feeling rested most days  No dryness of her mouth Memory is good No family history of obstructive sleep apnea  Has a history of chronic neck and back pain with back surgery in the past       Review of Systems  Eyes: Negative.   Respiratory:  Positive for apnea.   Gastrointestinal: Negative.   Endocrine: Negative.   Genitourinary: Negative.   Psychiatric/Behavioral:  Positive for sleep disturbance.    Past Medical History:  Diagnosis Date   Aortic atherosclerosis    Aortic atherosclerosis 05/01/2021   Chronic headaches    CKD (chronic kidney disease), stage II    Complex partial seizures (HCC)    Dyspnea 12/24/2006   B/P MV - normal perfusion in all regions; post stress LV normal in size; EF 63%; no significant ischemia noted   Hyperlipidemia    Hypertension    Palpitations 01/01/2007   30 day event monitor - 69 pt reported events- some PAC and PVCs reported   PVC's (premature ventricular contractions)    Sleep apnea    SOB (shortness of breath) 04/20/2012   MET test - exercise limiting factor - chronotropic incompetence (medication), peak VO2 76% of predicted; no EKG ST changes noted at sub-optimal peak HR   Social History   Socioeconomic History   Marital status: Married    Spouse name: Not on file   Number of children: Not on file   Years of education:  Not on file   Highest education level: Not on file  Occupational History   Not on file  Tobacco Use   Smoking status: Never   Smokeless tobacco: Never  Vaping Use   Vaping status: Never Used  Substance and Sexual Activity   Alcohol use: No   Drug use: No   Sexual activity: Not on file  Other Topics Concern   Not on file  Social History Narrative   Pt is R handed   Lives in 2 story home with her husband, Tonette.   Has 1 biological child / 1 step child   Associated degree in nursing   Disabled   Caffeine 16 0z daily   Social Drivers of Health   Tobacco Use: Low Risk (11/29/2024)   Patient History    Smoking Tobacco Use: Never    Smokeless Tobacco Use: Never    Passive Exposure: Not on file  Financial Resource Strain: Not on file  Food Insecurity: Low Risk (07/26/2024)   Received from [facility]   Epic    Within the past 12 months, you worried that your food would run out before you got money to buy more: Never true    Within the past 12 months, the food you bought just didn't last and you didn't have money to get more. : Never true  Transportation Needs:  No Transportation Needs (07/26/2024)   Received from [facility]    In the past 12 months, has lack of reliable transportation kept you from medical appointments, meetings, work or from getting things needed for daily living? : No  Physical Activity: Not on file  Stress: Not on file  Social Connections: Not on file  Intimate Partner Violence: Not on file  Depression (EYV7-0): Not on file  Alcohol Screen: Not on file  Housing: Low Risk (07/26/2024)   Received from [facility]   Epic    What is your living situation today?: I have a steady place to live    Think about the place you live. Do you have problems with any of the following? Choose all that apply:: None/None on this list  Utilities: Low Risk (07/26/2024)   Received from [facility]   Utilities    In the past 12 months has the electric,  gas, oil, or water company threatened to shut off services in your home? : No  Health Literacy: Not on file   Family History  Problem Relation Age of Onset   Diabetes Mother    Hypertension Mother    Breast cancer Mother 70   Asthma Brother    Healthy Brother        Objective:   Physical Exam Constitutional:      Appearance: Normal appearance.  HENT:     Head: Normocephalic.     Nose: No congestion.  Eyes:     General: No scleral icterus. Cardiovascular:     Rate and Rhythm: Normal rate and regular rhythm.     Pulses: Normal pulses.     Heart sounds: Normal heart sounds. No murmur heard.    No friction rub.  Pulmonary:     Effort: Pulmonary effort is normal. No respiratory distress.     Breath sounds: Normal breath sounds. No stridor. No wheezing or rhonchi.  Musculoskeletal:     Cervical back: No muscular tenderness.  Neurological:     General: No focal deficit present.     Mental Status: She is alert.  Psychiatric:        Mood and Affect: Mood normal.    Vitals:   11/29/24 1450  BP: 132/74  Pulse: 74  Temp: (!) 97.5 F (36.4 C)  SpO2: 99%      11/28/2022    3:00 PM 05/24/2019    4:00 PM  Results of the Epworth flowsheet  Sitting and reading 1 3  Watching TV 1 3  Sitting, inactive in a public place (e.g. a theatre or a meeting) 0 1  As a passenger in a car for an hour without a break 2 3  Lying down to rest in the afternoon when circumstances permit 0 2  Sitting and talking to someone 0 0  Sitting quietly after a lunch without alcohol 0 3  In a car, while stopped for a few minutes in traffic 0 2  Total score 4 17     Recent sleep study reviewed by myself Compliance data reveals excellent compliance with AHI below 2.7   CPAP compliance reviewed showing excellent compliance average use of 5 hours 42 minutes AutoSet 5-15 Residual AHI of 3.2  Most recent compliance 02/29/2024 to 05/28/2024 Average use of 6 hours 19 minutes 5-15 Residual AHI of  2.9  Assessment:     Severe obstructive sleep apnea Continues to be compliant with CPAP  Having some mask issues  Use of mask liners may  help  Protectant creams like zinc oxide and dimeticone may also help      Plan:     Encouraged to continue CPAP use on a nightly basis  Encouraged to continue using CPAP  Will continue to try to address mask issues  Graded activities as tolerated  Follow-up in about 6 months  She will be having hip surgery soon    Call with significant concerns  Graded activities as tolerated  I personally spent a total of 31 minutes in the care of the patient today including preparing to see the patient, getting/reviewing separately obtained history, performing a medically appropriate exam/evaluation, counseling and educating, documenting clinical information in the EHR, and independently interpreting results.   "

## 2024-11-29 NOTE — Addendum Note (Signed)
 Addended by: NEDA HAMMOND A on: 11/29/2024 04:53 PM   Modules accepted: Level of Service

## 2024-11-29 NOTE — Patient Instructions (Signed)
 Along with your mask liners  You can try zinc oxide cream prior to putting the CPAP on-works as a skin protectant  Dimeticone cream also works as a skin protectant  Continue using your CPAP on a nightly basis  Follow-up in about 6 months  Good luck with your hip surgery  Give us  a call with any significant concerns

## 2024-12-03 ENCOUNTER — Telehealth: Payer: Self-pay | Admitting: Cardiovascular Disease

## 2024-12-03 NOTE — Telephone Encounter (Signed)
 Patient would like to switch providers from Dr. Raford to Dr. Floretta due to more availability, as well as going to the same location as her husband. Please advise.

## 2024-12-09 ENCOUNTER — Ambulatory Visit (HOSPITAL_BASED_OUTPATIENT_CLINIC_OR_DEPARTMENT_OTHER): Admitting: Cardiovascular Disease

## 2024-12-17 ENCOUNTER — Encounter: Payer: Self-pay | Admitting: Pulmonary Disease

## 2024-12-17 DIAGNOSIS — G4733 Obstructive sleep apnea (adult) (pediatric): Secondary | ICD-10-CM

## 2024-12-20 NOTE — Telephone Encounter (Signed)
 Pt calling for a update about provider switch

## 2024-12-21 NOTE — Telephone Encounter (Signed)
 Dr Raford ok with provider switch Advised patient and will forward to Dr Floretta for review

## 2024-12-22 NOTE — Telephone Encounter (Signed)
" ° °  Contact your medical supply company to see if they can get you the AirTouch N 30I-fabric or Sleep Weaver mask  These are the 2 options that may fit different, so if your skin issues is related to sensitivity-these options may help  You can do a little research on them as well to see if they options you are willing to try   [facility]ly place order for CPAP supplies-new mask "

## 2024-12-22 NOTE — Telephone Encounter (Signed)
 Please advise on mask issues

## 2024-12-23 ENCOUNTER — Telehealth: Payer: Self-pay

## 2024-12-23 NOTE — Telephone Encounter (Signed)
 Copied from CRM #[id number]. Topic: General - Other >> Dec 13, 2024  3:11 PM Dedra B wrote: Reason for CRM: Patient wants to know if her provider is receiving data form her CPAP machine.   Spoke with patient VBU, everything is current and up dated

## 2025-01-27 ENCOUNTER — Ambulatory Visit (HOSPITAL_BASED_OUTPATIENT_CLINIC_OR_DEPARTMENT_OTHER): Admitting: Nurse Practitioner

## 2025-11-14 ENCOUNTER — Ambulatory Visit: Payer: Self-pay | Admitting: Neurology
